# Patient Record
Sex: Female | Born: 1975 | Hispanic: Yes | Marital: Married | Smoking: Never smoker
Health system: Southern US, Community
[De-identification: ages and names within clinical notes are randomized; demographics above are authoritative.]

## PROBLEM LIST (undated history)

## (undated) DIAGNOSIS — E781 Pure hyperglyceridemia: Secondary | ICD-10-CM

## (undated) DIAGNOSIS — I10 Essential (primary) hypertension: Secondary | ICD-10-CM

## (undated) HISTORY — DX: Essential (primary) hypertension: I10

## (undated) HISTORY — DX: Pure hyperglyceridemia: E78.1

---

## 2014-01-10 ENCOUNTER — Telehealth: Payer: Self-pay | Admitting: *Deleted

## 2014-01-10 ENCOUNTER — Encounter (INDEPENDENT_AMBULATORY_CARE_PROVIDER_SITE_OTHER): Payer: Self-pay

## 2014-01-10 NOTE — Telephone Encounter (Signed)
Patient walked into office today complaining with neck pain. The family member that was with her said that last night when the pain happened that her face started drooping and her speech went slurred. Family member advised to take her straight to the ER for evaluation. Family member agreed and understood the importance.

## 2014-01-15 ENCOUNTER — Ambulatory Visit (INDEPENDENT_AMBULATORY_CARE_PROVIDER_SITE_OTHER): Payer: Managed Care, Other (non HMO)

## 2014-01-15 ENCOUNTER — Ambulatory Visit (INDEPENDENT_AMBULATORY_CARE_PROVIDER_SITE_OTHER): Payer: Managed Care, Other (non HMO) | Admitting: Family Medicine

## 2014-01-15 ENCOUNTER — Telehealth: Payer: Self-pay | Admitting: Family Medicine

## 2014-01-15 VITALS — BP 123/82 | HR 84 | Temp 100.2°F | Ht 63.0 in | Wt 150.2 lb

## 2014-01-15 DIAGNOSIS — G4700 Insomnia, unspecified: Secondary | ICD-10-CM

## 2014-01-15 DIAGNOSIS — K219 Gastro-esophageal reflux disease without esophagitis: Secondary | ICD-10-CM

## 2014-01-15 DIAGNOSIS — R509 Fever, unspecified: Secondary | ICD-10-CM

## 2014-01-15 DIAGNOSIS — R1032 Left lower quadrant pain: Secondary | ICD-10-CM

## 2014-01-15 DIAGNOSIS — N390 Urinary tract infection, site not specified: Secondary | ICD-10-CM

## 2014-01-15 LAB — POCT CBC
Granulocyte percent: 80.8 %G — AB (ref 37–80)
HCT, POC: 39 % (ref 37.7–47.9)
Hemoglobin: 11.8 g/dL — AB (ref 12.2–16.2)
Lymph, poc: 2.2 (ref 0.6–3.4)
MCH, POC: 23.4 pg — AB (ref 27–31.2)
MCHC: 30.1 g/dL — AB (ref 31.8–35.4)
MCV: 77.6 fL — AB (ref 80–97)
MPV: 8 fL (ref 0–99.8)
POC Granulocyte: 9.7 — AB (ref 2–6.9)
POC LYMPH PERCENT: 18.3 %L (ref 10–50)
Platelet Count, POC: 310 10*3/uL (ref 142–424)
RBC: 5 M/uL (ref 4.04–5.48)
RDW, POC: 15.2 %
WBC: 12 10*3/uL — AB (ref 4.6–10.2)

## 2014-01-15 LAB — POCT URINALYSIS DIPSTICK
Bilirubin, UA: NEGATIVE
Glucose, UA: NEGATIVE
Ketones, UA: NEGATIVE
Nitrite, UA: NEGATIVE
Protein, UA: NEGATIVE
Spec Grav, UA: 1.02
Urobilinogen, UA: NEGATIVE
pH, UA: 6

## 2014-01-15 LAB — POCT URINE PREGNANCY: Preg Test, Ur: NEGATIVE

## 2014-01-15 LAB — POCT UA - MICROSCOPIC ONLY
Bacteria, U Microscopic: NEGATIVE
Casts, Ur, LPF, POC: NEGATIVE
Crystals, Ur, HPF, POC: NEGATIVE
Mucus, UA: NEGATIVE
Yeast, UA: NEGATIVE

## 2014-01-15 MED ORDER — METRONIDAZOLE 500 MG PO TABS: 500.0000 mg | ORAL_TABLET | Freq: Three times a day (TID) | ORAL | Status: DC

## 2014-01-15 MED ORDER — CIPROFLOXACIN HCL 500 MG PO TABS: 500.0000 mg | ORAL_TABLET | Freq: Two times a day (BID) | ORAL | Status: DC

## 2014-01-15 MED ORDER — OMEPRAZOLE 20 MG PO CPDR: 20.0000 mg | DELAYED_RELEASE_CAPSULE | Freq: Every day | ORAL | Status: DC

## 2014-01-15 MED ORDER — PROMETHAZINE HCL 25 MG PO TABS: ORAL_TABLET | ORAL | Status: DC

## 2014-01-15 NOTE — Addendum Note (Signed)
Addended by: Earlene Plater on: 01/15/2014 03:06 PM   Modules accepted: Orders

## 2014-01-15 NOTE — Telephone Encounter (Signed)
done

## 2014-01-15 NOTE — Progress Notes (Signed)
Subjective:    Patient ID: Toni Le, female    DOB: 03/15/1976, 38 y.o.   MRN: [id number]  HPI  This 38 y.o. female presents for evaluation of fever, insomnia, abdominal pain first week of April Then stopped.  She has been having another episode of fever, abdominal shaking and discomfort, Headache, diarrhea, and fatigue.  She was seen at the ED in [place] and she had CT scan Of head which was negative and she was rx'd fioricet.  She c/o umbilical pain.  She c/o nausea.   Review of Systems C/o fever and abdominal pain No chest pain, SOB, HA, dizziness, vision change, N/V, diarrhea, constipation, dysuria, urinary urgency or frequency, myalgias, arthralgias or rash.     Objective:   Physical Exam  Vital signs noted  Well developed well nourished female.  HEENT - Head atraumatic Normocephalic                Eyes - PERRLA, Conjuctiva - clear Sclera- Clear EOMI                Ears - EAC's Wnl TM's Wnl Gross Hearing WNL                Nose - Nares patent                 Throat - oropharanx wnl Respiratory - Lungs CTA bilateral Cardiac - RRR S1 and S2 without murmur GI - Abdomen soft tender LLQ and LUQ and bowel sounds active x 4 Extremities - No edema. Neuro - Grossly intact.  Results for orders placed in visit on 01/15/14  POCT CBC      Result Value Ref Range   WBC 12.0 (*) 4.6 - 10.2 K/uL   Lymph, poc 2.2  0.6 - 3.4   POC LYMPH PERCENT 18.3  10 - 50 %L   MID (cbc)    0 - 0.9   POC MID %    0 - 12 %M   POC Granulocyte 9.7 (*) 2 - 6.9   Granulocyte percent 80.8 (*) 37 - 80 %G   RBC 5.0  4.04 - 5.48 M/uL   Hemoglobin 11.8 (*) 12.2 - 16.2 g/dL   HCT, POC 39.0  37.7 - 47.9 %   MCV 77.6 (*) 80 - 97 fL   MCH, POC 23.4 (*) 27 - 31.2 pg   MCHC 30.1 (*) 31.8 - 35.4 g/dL   RDW, POC 15.2     Platelet Count, POC 310.0  142 - 424 K/uL   MPV 8.0  0 - 99.8 fL  POCT URINALYSIS DIPSTICK      Result Value Ref Range   Color, UA gold     Clarity, UA clear     Glucose, UA  neg     Bilirubin, UA neg     Ketones, UA neg     Spec Grav, UA 1.020     Blood, UA mod     pH, UA 6.0     Protein, UA neg     Urobilinogen, UA negative     Nitrite, UA neg     Leukocytes, UA large (3+)    POCT UA - MICROSCOPIC ONLY      Result Value Ref Range   WBC, Ur, HPF, POC 15-20     RBC, urine, microscopic 5-10     Bacteria, U Microscopic neg     Mucus, UA neg     Epithelial cells, urine per micros occ  Crystals, Ur, HPF, POC neg     Casts, Ur, LPF, POC neg     Yeast, UA neg    Kub - No free air  CXR - No infiltrate Prelimnary reading by Gwyndolyn Saxon [place],FNP    Assessment & Plan:  Fever, unspecified - Plan: POCT CBC, POCT urinalysis dipstick, POCT UA - Microscopic Only, DG Chest 2 View, DG Abd 1 View, ciprofloxacin (CIPRO) 500 MG tablet, metroNIDAZOLE (FLAGYL) 500 MG tablet  Abdominal pain, left lower quadrant - Plan: DG Abd 1 View, ciprofloxacin (CIPRO) 500 MG tablet, metroNIDAZOLE (FLAGYL) 500 MG tablet  UTI (urinary tract infection) - Plan: Urine culture, ciprofloxacin (CIPRO) 500 MG tablet, metroNIDAZOLE (FLAGYL) 500 MG tablet  Lysbeth Penner FNP

## 2014-01-17 LAB — URINE CULTURE

## 2014-01-22 ENCOUNTER — Ambulatory Visit: Payer: Managed Care, Other (non HMO) | Admitting: Family Medicine

## 2015-10-03 ENCOUNTER — Encounter: Payer: Self-pay | Admitting: Family

## 2015-10-03 ENCOUNTER — Ambulatory Visit (INDEPENDENT_AMBULATORY_CARE_PROVIDER_SITE_OTHER): Payer: Commercial Managed Care - HMO | Admitting: Family

## 2015-10-03 VITALS — BP 120/74 | HR 71 | Temp 99.1°F | Ht 63.0 in | Wt 161.4 lb

## 2015-10-03 DIAGNOSIS — Z23 Encounter for immunization: Secondary | ICD-10-CM

## 2015-10-03 DIAGNOSIS — Z0000 Encounter for general adult medical examination without abnormal findings: Secondary | ICD-10-CM | POA: Diagnosis not present

## 2015-10-03 DIAGNOSIS — Z01419 Encounter for gynecological examination (general) (routine) without abnormal findings: Secondary | ICD-10-CM | POA: Diagnosis not present

## 2015-10-03 DIAGNOSIS — K219 Gastro-esophageal reflux disease without esophagitis: Secondary | ICD-10-CM | POA: Insufficient documentation

## 2015-10-03 LAB — POCT URINALYSIS DIPSTICK
BILIRUBIN UA: NEGATIVE
Glucose, UA: NEGATIVE
KETONES UA: NEGATIVE
Leukocytes, UA: NEGATIVE
NITRITE UA: NEGATIVE
PROTEIN UA: NEGATIVE
Spec Grav, UA: 1.01
Urobilinogen, UA: NEGATIVE
pH, UA: 6.5

## 2015-10-03 LAB — POCT UA - MICROSCOPIC ONLY
BACTERIA, U MICROSCOPIC: NEGATIVE
CRYSTALS, UR, HPF, POC: NEGATIVE
Casts, Ur, LPF, POC: NEGATIVE
Mucus, UA: NEGATIVE
WBC, Ur, HPF, POC: NEGATIVE
Yeast, UA: NEGATIVE

## 2015-10-03 NOTE — Progress Notes (Signed)
   Subjective:    Patient ID: Toni Le, female    DOB: [birth year], 40 y.o.   MRN: [id number]  PT presents to the office today for CPE with pap.  Gynecologic Exam Associated symptoms include dysuria and frequency. Pertinent negatives include no headaches or hematuria.  Dysuria  This is a new problem. The current episode started in the past 7 days. The problem occurs intermittently. The problem has been waxing and waning. The quality of the pain is described as aching. The pain is at a severity of 3/10. The pain is mild. Associated symptoms include frequency and hesitancy. Pertinent negatives include no hematuria. She has tried increased fluids for the symptoms. The treatment provided mild relief.      Review of Systems  Constitutional: Negative.   HENT: Negative.   Eyes: Negative.   Respiratory: Negative.  Negative for shortness of breath.   Cardiovascular: Negative.  Negative for palpitations.  Gastrointestinal: Negative.   Endocrine: Negative.   Genitourinary: Positive for dysuria, hesitancy and frequency. Negative for hematuria.  Musculoskeletal: Negative.   Neurological: Negative.  Negative for headaches.  Hematological: Negative.   Psychiatric/Behavioral: Negative.   All other systems reviewed and are negative.      Objective:   Physical Exam  Constitutional: She is oriented to person, place, and time. She appears well-developed and well-nourished. No distress.  HENT:  Head: Normocephalic and atraumatic.  Right Ear: External ear normal.  Left Ear: External ear normal.  Nose: Nose normal.  Mouth/Throat: Oropharynx is clear and moist.  Eyes: Pupils are equal, round, and reactive to light.  Neck: Normal range of motion. Neck supple. No thyromegaly present.  Cardiovascular: Normal rate, regular rhythm, normal heart sounds and intact distal pulses.   No murmur heard. Pulmonary/Chest: Effort normal and breath sounds normal. No respiratory distress. She has no wheezes.  Right breast exhibits no inverted nipple, no mass, no nipple discharge, no skin change and no tenderness. Left breast exhibits no inverted nipple, no mass, no nipple discharge, no skin change and no tenderness. Breasts are symmetrical.  Abdominal: Soft. Bowel sounds are normal. She exhibits no distension. There is no tenderness.  Genitourinary: Vagina normal and uterus normal. Guaiac negative stool.  Bimanual exam- no adnexal masses or tenderness, ovaries nonpalpable   Cervix parous and pink- No discharge   Musculoskeletal: Normal range of motion. She exhibits no edema or tenderness.  Neurological: She is alert and oriented to person, place, and time. She has normal reflexes. No cranial nerve deficit.  Skin: Skin is warm and dry.  Psychiatric: She has a normal mood and affect. Her behavior is normal. Judgment and thought content normal.  Vitals reviewed.     BP 120/74 mmHg  Pulse 71  Temp(Src) 99.1 F (37.3 C) (Oral)  Ht '5\' 3"'$  (1.6 m)  Wt 161 lb 6.4 oz (73.211 kg)  BMI 28.60 kg/m2  LMP 09/02/2015 (Approximate)     Assessment & Plan:  1. Annual physical exam - Anemia Profile B - CMP14+EGFR - Lipid panel - Thyroid Panel With TSH - VITAMIN D 25 Hydroxy (Vit-D Deficiency, Fractures) - Pap IG w/ reflex to HPV when ASC-U - POCT urinalysis dipstick - POCT UA - Microscopic Only  2. Encounter for routine gynecological examination  - CMP14+EGFR - VITAMIN D 25 Hydroxy (Vit-D Deficiency, Fractures) - POCT urinalysis dipstick - POCT UA - Microscopic Only   Continue all meds Labs pending Health Maintenance reviewed Diet and exercise encouraged RTO 1 year  Evelina Dun, FNP

## 2015-10-03 NOTE — Patient Instructions (Addendum)
Toni Le (Health Maintenance, Female) Un estilo de vida saludable y los cuidados preventivos pueden favorecer considerablemente a la salud y Musician. Pregunte a su mdico cul es el cronograma de exmenes peridicos apropiado para usted. Esta es una buena oportunidad para consultarlo sobre cmo prevenir enfermedades y Toni Le sano. Adems de los controles, hay muchas otras cosas que puede hacer usted mismo. Los expertos han realizado numerosas investigaciones Toni Le cambios en el estilo de vida y las medidas de prevencin que, Toni Le, lo ayudarn a mantenerse sano. Solicite a su mdico ms informacin. EL PESO Y LA DIETA  Consuma una dieta saludable.  Asegrese de Toni Le verduras, frutas, productos lcteos de bajo contenido de Toni Le y Advertising account planner.  No consuma muchos alimentos de alto contenido de grasas slidas, azcares agregados o sal.  Realice actividad fsica con regularidad. Esta es una de las prcticas ms importantes que puede hacer por su salud.  La mayora de los adultos deben hacer ejercicio durante al menos 173mnutos por semana. El ejercicio debe aumentar la frecuencia cardaca y pActorla transpiracin (ejercicio de iEnola.  La mayora de los adultos tambin deben hField seismologistejercicios de elongacin al mToysRusveces a la semana. Agregue esto al su plan de ejercicio de intensidad moderada. Mantenga un peso saludable.  El ndice de masa corporal ([facility] es una medida que puede utilizarse para identificar posibles problemas de pBelle Valley Proporciona una estimacin de la grasa corporal basndose en el peso y la altura. Su mdico puede ayudarle a dRadiation protection practitionerIAvenely a lScientist, forensico mTheatre managerun peso saludable.  Para las mujeres de 20aos o ms:  Un IMadera Ambulatory Endoscopy Centermenor de 18,5 se considera bajo peso.  Un IAbbeville Area Medical Centerentre 18,5 y 24,9 es normal.  Un IMethodist Hospitalentre 25 y 29,9 se considera sobrepeso.  Un IMC de 30 o ms se considera  obesidad. Observe los niveles de colesterol y lpidos en la sangre.  Debe comenzar a rEnglish as a second language teacherde lpidos y cResearch officer, trade unionen la sangre a los 20aos y luego repetirlos cada 513aos  Es posible que nAutomotive engineerlos niveles de colesterol con mayor frecuencia si:  Sus niveles de lpidos y colesterol son altos.  Es mayor de 50aos.  Presenta un alto riesgo de padecer enfermedades cardacas. DETECCIN DE CNCER  Cncer de pulmn  Se recomienda realizar exmenes de deteccin de cncer de pulmn a personas adultas entre 557y 833aos que estn en riesgo de dHorticulturist, commercialde pulmn por sus antecedentes de consumo de tabaco.  Se recomienda una tomografa computarizada de baja dosis de los pLiberty Mediaaos a las personas que:  Fuman actualmente.  Hayan dejado el hbito en algn momento en los ltimos 15aos.  Hayan fumado durante 30aos un paquete diario. Un paquete-ao equivale a fumar un promedio de un paquete de cigarrillos diario durante un ao.  Los exmenes de deteccin anuales deben continuar hasta que hayan pasado 15aos desde que dej de fumar.  Ya no debern realizarse si tiene un problema de salud que le impida recibir tratamiento para eScience writerde pulmn. Cncer de mama  Practique la autoconciencia de la mama. Esto significa reconocer la apariencia normal de sus mamas y cmo las siente.  Tambin significa realizar autoexmenes regulares de lJohnson & Toni Informe a su mdico sobre cualquier cambio, sin importar cun pequeo sea.  Si tiene entre 20 y 31aos, un mdico debe realizarle un examen clnico de las mBrunswick Corporationparte del examen regular de sHohen[place] cK[place]  1 a 3aos.  Si tiene 40aos o ms, debe realizarse un examen clnico de las mamas todos los aos. Tambin considere realizarse una radiografa de las mamas (mamografa) todos los aos.  Si tiene antecedentes familiares de cncer de mama, hable con su mdico para someterse a un estudio  gentico.  Si tiene alto riesgo de padecer cncer de mama, hable con su mdico para someterse a una resonancia magntica y una mamografa todos los aos.  La evaluacin del gen del cncer de mama (BRCA) se recomienda a mujeres que tengan familiares con cnceres relacionados con el BRCA. Los cnceres relacionados con el BRCA incluyen los siguientes:  Mama.  Ovario.  Trompas.  Cnceres de peritoneo.  Los resultados de la evaluacin determinarn la necesidad de asesoramiento gentico y de anlisis de BRCA1 y BRCA2. Cncer de cuello del tero El mdico puede recomendarle que se haga pruebas peridicas de deteccin de cncer de los rganos de la pelvis (ovarios, tero y vagina). Estas pruebas incluyen un examen plvico, que abarca controlar si se produjeron cambios microscpicos en la superficie del cuello del tero (prueba de Papanicolaou). Pueden recomendarle que se haga estas pruebas cada 3aos, a partir de los 21aos.  A las mujeres que tienen entre 30 y 65aos, los mdicos pueden recomendarles que se sometan a exmenes plvicos y pruebas de Papanicolaou cada 3aos, o a la prueba de Papanicolaou y el examen plvico en combinacin con estudios de deteccin del virus del papiloma humano (VPH) cada 5aos. Algunos tipos de VPH aumentan el riesgo de padecer cncer de cuello del tero. La prueba para la deteccin del VPH tambin puede realizarse a mujeres de cualquier edad cuyos resultados de la prueba de Papanicolaou no sean claros.  Es posible que otros mdicos no recomienden exmenes de deteccin a mujeres no embarazadas que se consideran sujetos de bajo riesgo de padecer cncer de pelvis y que no tienen sntomas. Pregntele al mdico si un examen plvico de deteccin es adecuado para usted.  Si ha recibido un tratamiento para el cncer cervical o una enfermedad que podra causar cncer, necesitar realizarse una prueba de Papanicolaou y controles durante al menos 20 aos de concluido el  tratamiento. Si no se ha hecho el Papanicolaou con regularidad, debern volver a evaluarse los factores de riesgo (como tener un nuevo compaero sexual), para determinar si debe realizarse los estudios nuevamente. Algunas mujeres sufren problemas mdicos que aumentan la probabilidad de contraer cncer de cuello del tero. En estos casos, el mdico podr indicar que se realicen controles y pruebas de Papanicolaou con ms frecuencia. Cncer colorrectal  Este tipo de cncer puede detectarse y a menudo prevenirse.  Por lo general, los estudios de rutina se deben comenzar a hacer a partir de los 50 aos y hasta los 75 aos.  Sin embargo, el mdico podr aconsejarle que lo haga antes, si tiene factores de riesgo para el cncer de colon.  Tambin puede recomendarle que use un kit de prueba para hallar sangre oculta en la materia fecal.  Es posible que se use una pequea cmara en el extremo de un tubo para examinar directamente el colon (sigmoidoscopia o colonoscopia) a fin de detectar formas tempranas de cncer colorrectal.  Los exmenes de rutina generalmente comienzan a los 50aos.  El examen directo del colon se debe repetir cada 5 a 10aos hasta los 75aos. Sin embargo, es posible que se realicen exmenes con mayor frecuencia, si se detectan formas tempranas de plipos precancerosos o pequeos bultos. Cncer de piel  Revise   la piel de la cabeza a los pies con regularidad.  Informe a su mdico si aparecen nuevos lunares o los que tiene se modifican, especialmente en su forma y color.  Tambin notifique al mdico si tiene un lunar que es ms grande que el tamao de una goma de lpiz.  Siempre use pantalla solar. Aplique pantalla solar de Kerry Dory y repetida a lo largo del Training and development officer.  Protjase usando mangas y [employer], un sombrero de ala ancha y gafas para el sol, siempre que se encuentre en el exterior. ENFERMEDADES CARDACAS, DIABETES E HIPERTENSIN ARTERIAL   La hipertensin  arterial causa enfermedades cardacas y [place] el riesgo de ictus. La hipertensin arterial es ms probable en los siguientes casos:  Las personas que tienen la presin arterial en el extremo del rango normal (100-139/85-89 mm Hg).  Las personas con sobrepeso u obesidad.  Las Retail banker.  Si usted tiene entre 18 y 39 aos, debe medirse la presin arterial cada 3 a 5 aos. Si usted tiene 40 aos o ms, debe medirse la presin arterial [employer]. Debe medirse la presin arterial dos veces: una vez cuando est en un hospital o una clnica y la otra vez cuando est en otro sitio. Registre el promedio de [place]. Para controlar su presin arterial cuando no est en un hospital o [place], puede usar lo siguiente:  Ardelia Mems mquina automtica para medir la presin arterial en una farmacia.  Un monitor para medir la presin arterial en el hogar.  Si tiene entre 33 y 105 aos, consulte a su mdico si debe tomar aspirina para prevenir el ictus.  Realcese exmenes de deteccin de la diabetes con regularidad. Esto incluye la toma de [place] de sangre para controlar el nivel de azcar en la sangre durante el [place].  Si tiene un peso normal y un bajo riesgo de padecer diabetes, realcese este anlisis cada tres aos despus de los 45aos.  Si tiene sobrepeso y un alto riesgo de padecer diabetes, considere someterse a este anlisis antes o con mayor frecuencia. PREVENCIN DE INFECCIONES  HepatitisB  Si tiene un riesgo ms alto de Museum/gallery curator hepatitis B, debe someterse a un examen de deteccin de este virus. Se considera que tiene un alto riesgo de Museum/gallery curator hepatitis B si:  Naci en un pas donde la hepatitis B es frecuente. Pregntele a su mdico qu pases son considerados de Public affairs consultant.  Sus padres nacieron en un pas de alto riesgo y usted no recibi una vacuna que lo proteja contra la hepatitis B (vacuna contra la hepatitis B).  [place] agujas  para inyectarse drogas.  Vive con alguien que tiene hepatitis B.  Ha tenido sexo con alguien que tiene hepatitis B.  Recibe tratamiento de hemodilisis.  Toma ciertos medicamentos para el cncer, trasplante de rganos y afecciones autoinmunitarias. Hepatitis C  Se recomienda un anlisis de [place] para:  Todos los que nacieron entre 1945 y [id number].  Todas las personas que tengan un riesgo de haber contrado hepatitis C. Enfermedades de transmisin sexual (ETS).  Debe realizarse pruebas de deteccin de enfermedades de transmisin sexual (ETS), incluidas gonorrea y clamidia si:  Es sexualmente activo y es menor de 24aos.  Es mayor de 24aos, y Investment banker, operational informa que corre riesgo de tener este tipo de infecciones.  La actividad sexual ha cambiado desde que le hicieron la ltima prueba de deteccin y tiene un riesgo mayor de Best boy clamidia o Radio broadcast assistant. Pregntele  al mdico si usted tiene riesgo.  Si no tiene el VIH, pero corre riesgo de infectarse por el virus, se recomienda tomar diariamente un medicamento recetado para evitar la infeccin. Esto se conoce como profilaxis previa a la exposicin. Se considera que est en riesgo si:  Es activo sexualmente y no usa preservativos habitualmente o no conoce el estado del VIH de sus parejas sexuales.  Se inyecta drogas.  Es activo sexualmente con una pareja que tiene VIH. Consulte a su mdico para saber si tiene un alto riesgo de infectarse por el VIH. Si opta por comenzar la profilaxis previa a la exposicin, primero debe realizarse anlisis de deteccin del VIH. Luego, le harn anlisis cada 3meses mientras est tomando los medicamentos para la profilaxis previa a la exposicin.  EMBARAZO   Si es premenopusica y puede quedar embarazada, solicite a su mdico asesoramiento previo a la concepcin.  Si puede quedar embarazada, tome 400 a 800microgramos (mcg) de cido flico todos los das.  Si desea evitar el embarazo, hable con su  mdico sobre el control de la natalidad (anticoncepcin). OSTEOPOROSIS Y MENOPAUSIA   La osteoporosis es una enfermedad en la que los huesos pierden los minerales y la fuerza por el avance de la edad. El resultado pueden ser fracturas graves en los huesos. El riesgo de osteoporosis puede identificarse con una prueba de densidad sea.  Si tiene 65aos o ms, o si est en riesgo de sufrir osteoporosis y fracturas, pregunte a su mdico si debe someterse a exmenes.  Consulte a su mdico si debe tomar un suplemento de calcio o de vitamina D para reducir el riesgo de osteoporosis.  La menopausia puede presentar ciertos sntomas fsicos y riesgos.  La terapia de reemplazo hormonal puede reducir algunos de estos sntomas y riesgos. Consulte a su mdico para saber si la terapia de reemplazo hormonal es conveniente para usted.  INSTRUCCIONES PARA EL CUIDADO EN EL HOGAR   Realcese los estudios de rutina de la salud, dentales y de la vista.  Mantngase al da con las vacunas.  No consuma ningn producto que contenga tabaco, lo que incluye cigarrillos, tabaco de mascar o cigarrillos electrnicos.  Si est embarazada, no beba alcohol.  Si est amamantando, reduzca el consumo de alcohol y la frecuencia con la que consume.  Si es mujer y no est embarazada limite el consumo de alcohol a no ms de 1 medida por da. Una medida equivale a 12onzas de cerveza, 5onzas de vino o 1onzas de bebidas alcohlicas de alta graduacin.  No consuma drogas.  No comparta agujas.  Solicite ayuda a su mdico si necesita apoyo o informacin para abandonar las drogas.  Informe a su mdico si a menudo se siente deprimido.  Notifique a su mdico si alguna vez ha sido vctima de abuso o si no se siente seguro en su hogar.   Esta informacin no tiene como fin reemplazar el consejo del mdico. Asegrese de hacerle al mdico cualquier pregunta que tenga.   Document Released: 08/26/2011 Document Revised:  09/27/2014 Elsevier Interactive Patient Education 2016 Elsevier Inc.  

## 2015-10-04 LAB — THYROID PANEL WITH TSH
FREE THYROXINE INDEX: 1.4 (ref 1.2–4.9)
T3 Uptake Ratio: 21 % — ABNORMAL LOW (ref 24–39)
T4, Total: 6.6 ug/dL (ref 4.5–12.0)
TSH: 1.78 u[IU]/mL (ref 0.450–4.500)

## 2015-10-04 LAB — CMP14+EGFR
ALBUMIN: 4.3 g/dL (ref 3.5–5.5)
ALT: 15 IU/L (ref 0–32)
AST: 21 IU/L (ref 0–40)
Albumin/Globulin Ratio: 1.3 (ref 1.1–2.5)
Alkaline Phosphatase: 79 IU/L (ref 39–117)
BUN / CREAT RATIO: 10 (ref 8–20)
BUN: 6 mg/dL (ref 6–20)
Bilirubin Total: 0.2 mg/dL (ref 0.0–1.2)
CHLORIDE: 99 mmol/L (ref 96–106)
CO2: 22 mmol/L (ref 18–29)
Calcium: 9 mg/dL (ref 8.7–10.2)
Creatinine, Ser: 0.59 mg/dL (ref 0.57–1.00)
GFR calc Af Amer: 134 mL/min/{1.73_m2} (ref >59)
GFR calc non Af Amer: 116 mL/min/{1.73_m2} (ref >59)
GLOBULIN, TOTAL: 3.2 g/dL (ref 1.5–4.5)
Glucose: 88 mg/dL (ref 65–99)
POTASSIUM: 3.6 mmol/L (ref 3.5–5.2)
Sodium: 143 mmol/L (ref 134–144)
Total Protein: 7.5 g/dL (ref 6.0–8.5)

## 2015-10-04 LAB — ANEMIA PROFILE B
BASOS: 0 %
Basophils Absolute: 0 10*3/uL (ref 0.0–0.2)
EOS (ABSOLUTE): 0.1 10*3/uL (ref 0.0–0.4)
Eos: 1 %
Ferritin: 11 ng/mL — ABNORMAL LOW (ref 15–150)
Folate: 12.9 ng/mL (ref >3.0)
HEMATOCRIT: 36.4 % (ref 34.0–46.6)
Hemoglobin: 11.8 g/dL (ref 11.1–15.9)
IRON SATURATION: 11 % — AB (ref 15–55)
IRON: 49 ug/dL (ref 27–159)
Immature Grans (Abs): 0 10*3/uL (ref 0.0–0.1)
Immature Granulocytes: 0 %
LYMPHS ABS: 1.4 10*3/uL (ref 0.7–3.1)
Lymphs: 21 %
MCH: 24.8 pg — AB (ref 26.6–33.0)
MCHC: 32.4 g/dL (ref 31.5–35.7)
MCV: 77 fL — AB (ref 79–97)
MONOS ABS: 0.5 10*3/uL (ref 0.1–0.9)
Monocytes: 8 %
NEUTROS ABS: 4.7 10*3/uL (ref 1.4–7.0)
NEUTROS PCT: 70 %
Platelets: 273 10*3/uL (ref 150–379)
RBC: 4.76 x10E6/uL (ref 3.77–5.28)
RDW: 15.9 % — ABNORMAL HIGH (ref 12.3–15.4)
Retic Ct Pct: 1.2 % (ref 0.6–2.6)
Total Iron Binding Capacity: 437 ug/dL (ref 250–450)
UIBC: 388 ug/dL (ref 131–425)
VITAMIN B 12: 612 pg/mL (ref 211–946)
WBC: 6.7 10*3/uL (ref 3.4–10.8)

## 2015-10-04 LAB — LIPID PANEL
Chol/HDL Ratio: 4.7 ratio units — ABNORMAL HIGH (ref 0.0–4.4)
Cholesterol, Total: 182 mg/dL (ref 100–199)
HDL: 39 mg/dL — ABNORMAL LOW (ref >39)
LDL Calculated: 100 mg/dL — ABNORMAL HIGH (ref 0–99)
Triglycerides: 216 mg/dL — ABNORMAL HIGH (ref 0–149)
VLDL Cholesterol Cal: 43 mg/dL — ABNORMAL HIGH (ref 5–40)

## 2015-10-04 LAB — VITAMIN D 25 HYDROXY (VIT D DEFICIENCY, FRACTURES): VIT D 25 HYDROXY: 10.3 ng/mL — AB (ref 30.0–100.0)

## 2015-10-07 LAB — PAP IG W/ RFLX HPV ASCU: PAP Smear Comment: 0

## 2015-10-08 ENCOUNTER — Other Ambulatory Visit: Payer: Self-pay | Admitting: Family

## 2015-10-08 ENCOUNTER — Telehealth: Payer: Self-pay | Admitting: *Deleted

## 2015-10-08 DIAGNOSIS — E559 Vitamin D deficiency, unspecified: Secondary | ICD-10-CM | POA: Insufficient documentation

## 2015-10-08 MED ORDER — VITAMIN D (ERGOCALCIFEROL) 1.25 MG (50000 UNIT) PO CAPS: 50000.0000 [IU] | ORAL_CAPSULE | ORAL | Status: DC

## 2015-10-08 NOTE — Telephone Encounter (Signed)
Unable to contact patient by phone .  Letter mailed, please call for results.

## 2016-08-30 ENCOUNTER — Other Ambulatory Visit: Payer: Self-pay | Admitting: Family

## 2016-12-06 ENCOUNTER — Other Ambulatory Visit: Payer: Self-pay | Admitting: Family

## 2017-03-07 ENCOUNTER — Other Ambulatory Visit: Payer: Self-pay | Admitting: Family

## 2017-03-25 ENCOUNTER — Other Ambulatory Visit: Payer: Self-pay | Admitting: Family

## 2017-03-28 NOTE — Telephone Encounter (Signed)
Last Vit D 10/03/15  10.3

## 2017-06-20 ENCOUNTER — Other Ambulatory Visit: Payer: Self-pay | Admitting: Family

## 2017-06-20 NOTE — Telephone Encounter (Signed)
Last Vit D 10/02/16  10.3

## 2017-10-03 ENCOUNTER — Encounter: Payer: Self-pay | Admitting: Family

## 2017-10-03 ENCOUNTER — Ambulatory Visit (INDEPENDENT_AMBULATORY_CARE_PROVIDER_SITE_OTHER): Payer: BLUE CROSS/BLUE SHIELD | Admitting: Family

## 2017-10-03 VITALS — BP 136/94 | HR 77 | Temp 97.7°F | Ht 63.0 in | Wt 171.0 lb

## 2017-10-03 DIAGNOSIS — J301 Allergic rhinitis due to pollen: Secondary | ICD-10-CM

## 2017-10-03 DIAGNOSIS — R102 Pelvic and perineal pain: Secondary | ICD-10-CM | POA: Diagnosis not present

## 2017-10-03 DIAGNOSIS — Z0000 Encounter for general adult medical examination without abnormal findings: Secondary | ICD-10-CM

## 2017-10-03 DIAGNOSIS — Z01419 Encounter for gynecological examination (general) (routine) without abnormal findings: Secondary | ICD-10-CM

## 2017-10-03 DIAGNOSIS — E663 Overweight: Secondary | ICD-10-CM | POA: Insufficient documentation

## 2017-10-03 DIAGNOSIS — E669 Obesity, unspecified: Secondary | ICD-10-CM | POA: Diagnosis not present

## 2017-10-03 LAB — URINALYSIS, COMPLETE
BILIRUBIN UA: NEGATIVE
GLUCOSE, UA: NEGATIVE
KETONES UA: NEGATIVE
LEUKOCYTES UA: NEGATIVE
Nitrite, UA: NEGATIVE
SPEC GRAV UA: 1.02 (ref 1.005–1.030)
Urobilinogen, Ur: 0.2 mg/dL (ref 0.2–1.0)
pH, UA: 7 (ref 5.0–7.5)

## 2017-10-03 LAB — MICROSCOPIC EXAMINATION: RENAL EPITHEL UA: NONE SEEN /HPF

## 2017-10-03 MED ORDER — FLUTICASONE PROPIONATE 50 MCG/ACT NA SUSP: 2.0000 | Freq: Every day | NASAL | 6 refills | Status: DC

## 2017-10-03 NOTE — Patient Instructions (Signed)
[place] (Health Maintenance, Female) Un estilo de vida saludable y los cuidados preventivos pueden favorecer considerablemente a la salud y Musician. Pregunte a su mdico cul es el cronograma de exmenes peridicos apropiado para usted. Esta es una buena oportunidad para consultarlo sobre cmo prevenir enfermedades y [place] sano. Adems de los controles, hay muchas otras cosas que puede hacer usted mismo. Los expertos han realizado numerosas investigaciones [name] en el estilo de vida y las medidas de prevencin que, [place], lo ayudarn a mantenerse sano. Solicite a su mdico ms informacin. EL PESO Y LA DIETA Consuma una dieta saludable.  Asegrese de [employer] verduras, frutas, productos lcteos de bajo contenido de [place] y Advertising account planner.  No consuma muchos alimentos de alto contenido de grasas slidas, azcares agregados o sal.  Realice actividad fsica con regularidad. Esta es una de las prcticas ms importantes que puede hacer por su salud. ? La Delorise Shiner de los adultos deben hacer ejercicio durante al menos 124mnutos por semana. El ejercicio debe aumentar la frecuencia cardaca y pActorla transpiracin (ejercicio de [place]. ? La mayora de los adultos tambin deben hacer ejercicios de elongacin al mToysRusveces a la semana. Agregue esto al su plan de ejercicio de intensidad moderada. Mantenga un peso saludable.  El ndice de masa corporal ([facility] es una medida que puede utilizarse para identificar posibles problemas de pEast Uniontown Proporciona una estimacin de la grasa corporal basndose en el peso y la altura. Su mdico puede ayudarle a dRadiation protection practitionerISouth Endy a lScientist, forensico mTheatre managerun peso saludable.  Para las mujeres de 20aos o ms: ? Un IJohn R. Oishei Children'S Hospitalmenor de 18,5 se considera bajo peso. ? Un ICumberland County Hospitalentre 18,5 y 24,9 es normal. ? Un IPelham Medical Centerentre 25 y 29,9 se considera sobrepeso. ? Un IMC de 30 o ms se considera  obesidad. Observe los niveles de colesterol y lpidos en la sangre.  Debe comenzar a rEnglish as a second language teacherde lpidos y cResearch officer, trade unionen la sangre a los 20aos y luego repetirlos cada 516aos  Es posible que nAutomotive engineerlos niveles de colesterol con mayor frecuencia si: ? Sus niveles de lpidos y colesterol son altos. ? Es mayor de 527CWC ? Presenta un alto riesgo de padecer enfermedades cardacas. DETECCIN DE CNCER Cncer de pulmn  Se recomienda realizar exmenes de deteccin de cncer de pulmn a personas adultas entre 574y 892aos que estn en riesgo de dHorticulturist, commercialde pulmn por sus antecedentes de consumo de tabaco.  Se recomienda una tomografa computarizada de baja dosis de los pulmones todos los aos a las personas que: ? Fuman actualmente. ? Hayan dejado el hbito en algn momento en los ltimos 15aos. ? Hayan fumado durante 30aos un paquete diario. Un paquete-ao equivale a fumar un promedio de un paquete de cigarrillos diario durante un ao.  Los exmenes de deteccin anuales deben continuar hasta que hayan pasado 15aos desde que dej de fumar.  Ya no debern realizarse si tiene un problema de salud que le impida recibir tratamiento para eScience writerde pulmn. Cncer de mama  Practique la autoconciencia de la mama. Esto significa reconocer la apariencia normal de sus mamas y cmo las siente.  Tambin significa realizar autoexmenes regulares de lJohnson & Johnson Informe a su mdico sobre cualquier cambio, sin importar cun pequeo sea.  Si tiene entre 20 y 363aos, un mdico debe realizarle un examen clnico de las mamas como parte del examen regular de s[place] cada 1 a  3aos.  Si tiene 40aos o ms, debe realizarse un examen clnico de las mamas todos los aos. Tambin considere realizarse una radiografa de las mamas (mamografa) todos los aos.  Si tiene antecedentes familiares de cncer de mama, hable con su mdico para someterse a un estudio gentico.  Si  tiene alto riesgo de padecer cncer de mama, hable con su mdico para someterse a una resonancia magntica y una mamografa todos los aos.  La evaluacin del gen del cncer de mama (BRCA) se recomienda a mujeres que tengan familiares con cnceres relacionados con el BRCA. Los cnceres relacionados con el BRCA incluyen los siguientes: ? Mama. ? Ovario. ? Trompas. ? Cnceres de peritoneo.  Los resultados de la evaluacin determinarn la necesidad de asesoramiento gentico y de anlisis de BRCA1 y BRCA2. Cncer de cuello del tero El mdico puede recomendarle que se haga pruebas peridicas de deteccin de cncer de los rganos de la pelvis (ovarios, tero y vagina). Estas pruebas incluyen un examen plvico, que abarca controlar si se produjeron cambios microscpicos en la superficie del cuello del tero (prueba de Papanicolaou). Pueden recomendarle que se haga estas pruebas cada 3aos, a partir de los 21aos.  A las mujeres que tienen entre 30 y 65aos, los mdicos pueden recomendarles que se sometan a exmenes plvicos y pruebas de Papanicolaou cada 3aos, o a la prueba de Papanicolaou y el examen plvico en combinacin con estudios de deteccin del virus del papiloma humano (VPH) cada 5aos. Algunos tipos de VPH aumentan el riesgo de padecer cncer de cuello del tero. La prueba para la deteccin del VPH tambin puede realizarse a mujeres de cualquier edad cuyos resultados de la prueba de Papanicolaou no sean claros.  Es posible que otros mdicos no recomienden exmenes de deteccin a mujeres no embarazadas que se consideran sujetos de bajo riesgo de padecer cncer de pelvis y que no tienen sntomas. Pregntele al mdico si un examen plvico de deteccin es adecuado para usted.  Si ha recibido un tratamiento para el cncer cervical o una enfermedad que podra causar cncer, necesitar realizarse una prueba de Papanicolaou y controles durante al menos 20 aos de concluido el tratamiento. Si no se  ha hecho el Papanicolaou con regularidad, debern volver a evaluarse los factores de riesgo (como tener un nuevo compaero sexual), para determinar si debe realizarse los estudios nuevamente. Algunas mujeres sufren problemas mdicos que aumentan la probabilidad de contraer cncer de cuello del tero. En estos casos, el mdico podr indicar que se realicen controles y pruebas de Papanicolaou con ms frecuencia. Cncer colorrectal  Este tipo de cncer puede detectarse y a menudo prevenirse.  Por lo general, los estudios de rutina se deben comenzar a hacer a partir de los 50 aos y hasta los 75 aos.  Sin embargo, el mdico podr aconsejarle que lo haga antes, si tiene factores de riesgo para el cncer de colon.  Tambin puede recomendarle que use un kit de prueba para hallar sangre oculta en la materia fecal.  Es posible que se use una pequea cmara en el extremo de un tubo para examinar directamente el colon (sigmoidoscopia o colonoscopia) a fin de detectar formas tempranas de cncer colorrectal.  Los exmenes de rutina generalmente comienzan a los 50aos.  El examen directo del colon se debe repetir cada 5 a 10aos hasta los 75aos. Sin embargo, es posible que se realicen exmenes con mayor frecuencia, si se detectan formas tempranas de plipos precancerosos o pequeos bultos. Cncer de piel  Revise la piel   de la cabeza a los pies con regularidad.  Informe a su mdico si aparecen nuevos lunares o los que tiene se modifican, especialmente en su forma y color.  Tambin notifique al mdico si tiene un lunar que es ms grande que el tamao de una goma de lpiz.  Siempre use pantalla solar. Aplique pantalla solar de manera libre y repetida a lo largo del da.  Protjase usando mangas y pantalones largos, un sombrero de ala ancha y gafas para el sol, siempre que se encuentre en el exterior. ENFERMEDADES CARDACAS, DIABETES E HIPERTENSIN ARTERIAL  La hipertensin arterial causa  enfermedades cardacas y aumenta el riesgo de ictus. La hipertensin arterial es ms probable en los siguientes casos: ? Las personas que tienen la presin arterial en el extremo del rango normal (100-139/85-89 mm Hg). ? Las personas con sobrepeso u obesidad. ? Las personas afroamericanas.  Si usted tiene entre 18 y 39 aos, debe medirse la presin arterial cada 3 a 5 aos. Si usted tiene 40 aos o ms, debe medirse la presin arterial todos los aos. Debe medirse la presin arterial dos veces: una vez cuando est en un hospital o una clnica y la otra vez cuando est en otro sitio. Registre el promedio de las dos mediciones. Para controlar su presin arterial cuando no est en un hospital o una clnica, puede usar lo siguiente: ? Una mquina automtica para medir la presin arterial en una farmacia. ? Un monitor para medir la presin arterial en el hogar.  Si tiene entre 55 y 79 aos, consulte a su mdico si debe tomar aspirina para prevenir el ictus.  Realcese exmenes de deteccin de la diabetes con regularidad. Esto incluye la toma de una muestra de sangre para controlar el nivel de azcar en la sangre durante el ayuno. ? Si tiene un peso normal y un bajo riesgo de padecer diabetes, realcese este anlisis cada tres aos despus de los 45aos. ? Si tiene sobrepeso y un alto riesgo de padecer diabetes, considere someterse a este anlisis antes o con mayor frecuencia. PREVENCIN DE INFECCIONES HepatitisB  Si tiene un riesgo ms alto de contraer hepatitis B, debe someterse a un examen de deteccin de este virus. Se considera que tiene un alto riesgo de contraer hepatitis B si: ? Naci en un pas donde la hepatitis B es frecuente. Pregntele a su mdico qu pases son considerados de alto riesgo. ? Sus padres nacieron en un pas de alto riesgo y usted no recibi una vacuna que lo proteja contra la hepatitis B (vacuna contra la hepatitis B). ? Tiene VIH o sida. ? Usa agujas para inyectarse  drogas. ? Vive con alguien que tiene hepatitis B. ? Ha tenido sexo con alguien que tiene hepatitis B. ? Recibe tratamiento de hemodilisis. ? Toma ciertos medicamentos para el cncer, trasplante de rganos y afecciones autoinmunitarias. Hepatitis C  Se recomienda un anlisis de sangre para: ? Todos los que nacieron entre 1945 y 1965. ? Todas las personas que tengan un riesgo de haber contrado hepatitis C. Enfermedades de transmisin sexual (ETS).  Debe realizarse pruebas de deteccin de enfermedades de transmisin sexual (ETS), incluidas gonorrea y clamidia si: ? Es sexualmente activo y es menor de 24aos. ? Es mayor de 24aos, y el mdico le informa que corre riesgo de tener este tipo de infecciones. ? La actividad sexual ha cambiado desde que le hicieron la ltima prueba de deteccin y tiene un riesgo mayor de tener clamidia o gonorrea. Pregntele al mdico si usted   tiene riesgo.  Si no tiene el VIH, pero corre riesgo de infectarse por el virus, se recomienda tomar diariamente un medicamento recetado para evitar la infeccin. Esto se conoce como profilaxis previa a la exposicin. Se considera que est en riesgo si: ? Es [place] sexualmente y no [place] preservativos habitualmente o no conoce el estado del VIH de sus Advertising copywriter. ? Se inyecta drogas. ? Es [place] sexualmente con Ardelia Mems pareja que tiene VIH. Consulte a su mdico para saber si tiene un alto riesgo de infectarse por el VIH. Si opta por comenzar la profilaxis previa a la exposicin, primero debe realizarse anlisis de deteccin del VIH. Luego, le harn anlisis cada 34mses mientras est tomando los medicamentos para la profilaxis previa a la exposicin. [facility] Si es premenopusica y puede quedar e[place] solicite a su mdico asesoramiento previo a la concepcin.  Si puede quedar embarazada, tome 400 a [id number](mcg) de cido fAnheuser-Busch  Si desea evitar el embarazo, hable con su mdico sobre el  control de la natalidad (anticoncepcin). OSTEOPOROSIS Y MENOPAUSIA  La osteoporosis es una enfermedad en la que los huesos pierden los minerales y la fuerza por el avance de la edad. El resultado pueden ser fracturas graves en los h[place] El riesgo de osteoporosis puede identificarse con uArdelia Memsprueba de densidad sea.  Si tiene 65aos o ms, o si est en riesgo de sufrir osteoporosis y fracturas, pregunte a su mdico si debe someterse a exmenes.  Consulte a su mdico si debe tomar un suplemento de calcio o de vitamina D para reducir el riesgo de osteoporosis.  La menopausia puede presentar ciertos sntomas fsicos y rGaffer  La terapia de reemplazo hormonal puede reducir algunos de estos sntomas y rGaffer Consulte a su mdico para saber si la terapia de reemplazo hormonal es conveniente para usted. INSTRUCCIONES PARA EL CUIDADO EN EL HOGAR  Realcese los estudios de rutina de la salud, dentales y de lPublic librarian  MBath  No consuma ningn producto que contenga tabaco, lo que incluye cigarrillos, tabaco de mHigher education careers advisero cPsychologist, sport and exercise  Si est embarazada, no beba alcohol.  Si est amamantando, reduzca el consumo de alcohol y la frecuencia con la que consume.  Si es mujer y no est embarazada limite el consumo de alcohol a no ms de 1 medida por da. Una medida equivale a 12onzas de cerveza, 5onzas de vino o 1onzas de bebidas alcohlicas de alta graduacin.  No consuma drogas.  No comparta agujas.  Solicite ayuda a su mdico si necesita apoyo o informacin para abandonar las drogas.  Informe a su mdico si a menudo se siente deprimido.  Notifique a su mdico si alguna vez ha sido vctima de abuso o si no se siente seguro en su hogar. Esta informacin no tiene cMarine scientistel consejo del mdico. Asegrese de hacerle al mdico cualquier pregunta que tenga. Document Released: 08/26/2011 Document Revised: 09/27/2014 Document Reviewed:  06/10/2015 Elsevier Interactive Patient Education  2[employer]

## 2017-10-03 NOTE — Progress Notes (Signed)
   Subjective:    Patient ID: Toni Le, female    DOB: 1976-05-07, 42 y.o.   MRN: [id number]  Pt presents to the office today for CPE with pap. Pt currently not taking any medications at this time.  Gynecologic Exam  The patient's pertinent negatives include no genital lesions, genital odor, missed menses, vaginal bleeding or vaginal discharge. Associated symptoms include abdominal pain (lower at times). Pertinent negatives include no frequency, joint pain, nausea or painful intercourse.      Review of Systems  Gastrointestinal: Positive for abdominal pain (lower at times). Negative for nausea.  Genitourinary: Negative for frequency, missed menses and vaginal discharge.  Musculoskeletal: Negative for joint pain.  All other systems reviewed and are negative.      Objective:   Physical Exam  Constitutional: She is oriented to person, place, and time. She appears well-developed and well-nourished. No distress.  HENT:  Head: Normocephalic and atraumatic.  Right Ear: External ear normal.  Left Ear: External ear normal.  Nose: Mucosal edema and rhinorrhea present.  Mouth/Throat: Posterior oropharyngeal erythema present.  Eyes: Pupils are equal, round, and reactive to light.  Neck: Normal range of motion. Neck supple. No thyromegaly present.  Cardiovascular: Normal rate, regular rhythm, normal heart sounds and intact distal pulses.  No murmur heard. Pulmonary/Chest: Effort normal and breath sounds normal. No respiratory distress. She has no wheezes. Right breast exhibits no inverted nipple, no mass, no nipple discharge, no skin change and no tenderness. Left breast exhibits no inverted nipple, no mass, no nipple discharge, no skin change and no tenderness. Breasts are symmetrical.  Abdominal: Soft. Bowel sounds are normal. She exhibits no distension. There is no tenderness.  Genitourinary: Vagina normal.  Genitourinary Comments: Bimanual exam- no adnexal masses or tenderness, ovaries  nonpalpable   Cervix parous and pink- No discharge   Musculoskeletal: Normal range of motion. She exhibits no edema or tenderness.  Neurological: She is alert and oriented to person, place, and time.  Skin: Skin is warm and dry.  Psychiatric: She has a normal mood and affect. Her behavior is normal. Judgment and thought content normal.  Vitals reviewed.    BP (!) 136/94   Pulse 77   Temp 97.7 F (36.5 C) (Oral)   Ht '5\' 3"'$  (1.6 m)   Wt 171 lb (77.6 kg)   LMP 08/24/2017   BMI 30.29 kg/m      Assessment & Plan:  1. Pelvic pain - Urinalysis, Complete - CMP14+EGFR  2. Allergic rhinitis due to pollen, unspecified seasonality - fluticasone (FLONASE) 50 MCG/ACT nasal spray; Place 2 sprays into both nostrils daily.  Dispense: 16 g; Refill: 6 - CMP14+EGFR  3. Annual physical exam - Anemia Profile B - CMP14+EGFR - Lipid panel - TSH - VITAMIN D 25 Hydroxy (Vit-D Deficiency, Fractures) - Pap IG w/ reflex to HPV when ASC-U  4. Gynecologic exam normal - CMP14+EGFR - Pap IG w/ reflex to HPV when ASC-U  5. Obesity (BMI 30-39.9) - CMP14+EGFR   Continue all meds Labs pending Health Maintenance reviewed Diet and exercise encouraged RTO 1 year or if lower abd pain worsen may need Transvaginal US  Jocabed Cheese, FNP

## 2017-10-04 ENCOUNTER — Other Ambulatory Visit: Payer: Self-pay | Admitting: Family

## 2017-10-04 DIAGNOSIS — E781 Pure hyperglyceridemia: Secondary | ICD-10-CM

## 2017-10-04 LAB — ANEMIA PROFILE B
BASOS ABS: 0 10*3/uL (ref 0.0–0.2)
Basos: 0 %
EOS (ABSOLUTE): 0.1 10*3/uL (ref 0.0–0.4)
EOS: 1 %
FOLATE: 13.2 ng/mL (ref >3.0)
Ferritin: 23 ng/mL (ref 15–150)
HEMOGLOBIN: 12.5 g/dL (ref 11.1–15.9)
Hematocrit: 39.2 % (ref 34.0–46.6)
IMMATURE GRANULOCYTES: 0 %
IRON SATURATION: 17 % (ref 15–55)
IRON: 65 ug/dL (ref 27–159)
Immature Grans (Abs): 0 10*3/uL (ref 0.0–0.1)
Lymphocytes Absolute: 1.7 10*3/uL (ref 0.7–3.1)
Lymphs: 23 %
MCH: 25.8 pg — ABNORMAL LOW (ref 26.6–33.0)
MCHC: 31.9 g/dL (ref 31.5–35.7)
MCV: 81 fL (ref 79–97)
MONOCYTES: 7 %
Monocytes Absolute: 0.6 10*3/uL (ref 0.1–0.9)
NEUTROS ABS: 5.1 10*3/uL (ref 1.4–7.0)
Neutrophils: 69 %
Platelets: 314 10*3/uL (ref 150–379)
RBC: 4.85 x10E6/uL (ref 3.77–5.28)
RDW: 14.9 % (ref 12.3–15.4)
RETIC CT PCT: 2.3 % (ref 0.6–2.6)
TIBC: 381 ug/dL (ref 250–450)
UIBC: 316 ug/dL (ref 131–425)
VITAMIN B 12: 504 pg/mL (ref 232–1245)
WBC: 7.4 10*3/uL (ref 3.4–10.8)

## 2017-10-04 LAB — LIPID PANEL
Chol/HDL Ratio: 5 ratio — ABNORMAL HIGH (ref 0.0–4.4)
Cholesterol, Total: 166 mg/dL (ref 100–199)
HDL: 33 mg/dL — ABNORMAL LOW (ref >39)
LDL Calculated: 89 mg/dL (ref 0–99)
Triglycerides: 221 mg/dL — ABNORMAL HIGH (ref 0–149)
VLDL CHOLESTEROL CAL: 44 mg/dL — AB (ref 5–40)

## 2017-10-04 LAB — CMP14+EGFR
ALBUMIN: 4.2 g/dL (ref 3.5–5.5)
ALK PHOS: 84 IU/L (ref 39–117)
ALT: 22 IU/L (ref 0–32)
AST: 22 IU/L (ref 0–40)
Albumin/Globulin Ratio: 1.3 (ref 1.2–2.2)
BUN / CREAT RATIO: 11 (ref 9–23)
BUN: 7 mg/dL (ref 6–24)
Bilirubin Total: 0.3 mg/dL (ref 0.0–1.2)
CO2: 26 mmol/L (ref 20–29)
Calcium: 9 mg/dL (ref 8.7–10.2)
Chloride: 101 mmol/L (ref 96–106)
Creatinine, Ser: 0.64 mg/dL (ref 0.57–1.00)
GFR calc non Af Amer: 111 mL/min/{1.73_m2} (ref >59)
GFR, EST AFRICAN AMERICAN: 128 mL/min/{1.73_m2} (ref >59)
GLOBULIN, TOTAL: 3.2 g/dL (ref 1.5–4.5)
Glucose: 93 mg/dL (ref 65–99)
Potassium: 3.8 mmol/L (ref 3.5–5.2)
SODIUM: 141 mmol/L (ref 134–144)
TOTAL PROTEIN: 7.4 g/dL (ref 6.0–8.5)

## 2017-10-04 LAB — VITAMIN D 25 HYDROXY (VIT D DEFICIENCY, FRACTURES): VIT D 25 HYDROXY: 22.4 ng/mL — AB (ref 30.0–100.0)

## 2017-10-04 LAB — PAP IG W/ RFLX HPV ASCU: PAP Smear Comment: 0

## 2017-10-04 LAB — TSH: TSH: 2.09 u[IU]/mL (ref 0.450–4.500)

## 2017-10-04 MED ORDER — FLUCONAZOLE 150 MG PO TABS: 150.0000 mg | ORAL_TABLET | ORAL | 0 refills | Status: DC | PRN

## 2017-10-04 MED ORDER — VITAMIN D (ERGOCALCIFEROL) 1.25 MG (50000 UNIT) PO CAPS: 50000.0000 [IU] | ORAL_CAPSULE | ORAL | 3 refills | Status: DC

## 2018-07-21 ENCOUNTER — Ambulatory Visit: Payer: BLUE CROSS/BLUE SHIELD | Admitting: Family

## 2018-07-21 ENCOUNTER — Encounter: Payer: Self-pay | Admitting: Family

## 2018-07-21 VITALS — BP 131/91 | HR 76 | Temp 97.9°F | Ht 63.0 in | Wt 169.4 lb

## 2018-07-21 DIAGNOSIS — Z23 Encounter for immunization: Secondary | ICD-10-CM | POA: Diagnosis not present

## 2018-07-21 DIAGNOSIS — N6320 Unspecified lump in the left breast, unspecified quadrant: Secondary | ICD-10-CM

## 2018-07-21 NOTE — Patient Instructions (Addendum)
Mamografa Mammogram [employer] es un radiografa de las mamas que se realiza para determinar si hay cambios anormales. Este procedimiento permite explorar y Actuary cambio que pudiera sugerir la presencia de cncer de mama. La mamografa tambin puede identificar otros cambios y variaciones en las [place], por ejemplo:  La inflamacin del tejido mamario (mastitis).  Una zona infectada que contiene una acumulacin de pus (absceso).  Una cavidad llena de lquido (quiste).  Cambios fibroqusticos. Estos ocurren cuando el tejido [employer] se vuelve ms denso, lo que, a la palpacin, puede hacer que se perciba como una cuerda o una superficie irregular debajo de la piel.  Tumores no cancerosos (benignos).  Informe al mdico acerca de lo siguiente:  Cualquier alergia que tenga.  Si tiene implantes mamarios.  Si ha tenido enfermedades, biopsias o cirugas previas de Scientist, research (medical).  Si est amamantando.  Cualquier posibilidad de que estuviera embarazada, si corresponde.  Si es menor de 25aos.  Si tiene antecedentes familiares de cncer de mama. Cules son los riesgos? En general, se trata de un procedimiento seguro. Sin embargo, pueden ocurrir complicaciones, por ejemplo:  Exposicin a la radiacin. En [employer], los [place] de radiacin son muy bajos.  La interpretacin [employer].  La necesidad de Optometrist ms [employer].  La imposibilidad de la mamografa de Hydrographic surveyor algunos tipos de cncer.  Qu ocurre antes del procedimiento?  Programe el estudio para hacrselo aproximadamente 1 o 2semanas despus de la [place]. Generalmente, este es el momento en que las mamas estn menos sensibles.  Si se hizo Musician en un centro diferente en el pasado, trate de conseguirla o solicite que la enven al nuevo centro de estudios con el fin de compararlas.  El da del estudio, lvese las [place] y [place].  No use desodorantes, perfumes, lociones o  talcos en ningn lugar del cuerpo el da del [place].  Qutese las alhajas del cuello.  Use prendas que pueda ponerse y sacarse fcilmente. Qu ocurre durante el procedimiento?  Tendr que desvestirse de la cintura para [place] y ponerse una bata de hospital.  Debe permanecer de pie delante de la mquina de rayos-X.  Se colocar cada mama entre dos placas de [place] unos segundos. Durante el procedimiento, intente estar lo ms relajada posible. Esto no causa ningn dao a las [place], y, si siente [employer], ser pasajera.  Se tomarn radiografas desde diferentes ngulos de cada mama. Este procedimiento puede variar segn el mdico y el hospital. Sander Nephew sucede despus del procedimiento?  La mamografa ser examinada por un especialista (radilogo).  En funcin de la calidad de [employer], es posible que tenga que repetir algunas partes del [place]. Generalmente, esto se realiza si el radilogo necesita una vista mejor del tejido [place].  Pregunte la fecha en que los resultados estarn disponibles. Asegrese de [employer].  Puede reanudar sus actividades normales. Esta informacin no tiene Marine scientist el consejo del mdico. Asegrese de hacerle al mdico cualquier pregunta que tenga. Document Released: 06/16/2005 Document Revised: 12/14/2016 Document Reviewed: 11/15/2014 Elsevier Interactive Patient Education  Henry Schein.

## 2018-07-21 NOTE — Progress Notes (Signed)
   Subjective:    Patient ID: Toni Le, female    DOB: August 23, 1976, 42 y.o.   MRN: [id number]  Chief Complaint  Patient presents with  . lump in left breast    HPI Pt presents to the office today with a lump in her left breast that she noticed about two weeks ago. She reports it has become smaller. She states she has pain when she touches it.  She has never had a mammogram.   Denies any family hx of breast cancer.    Review of Systems  All other systems reviewed and are negative.      Objective:   Physical Exam  Constitutional: She is oriented to person, place, and time. She appears well-developed and well-nourished. No distress.  HENT:  Head: Normocephalic and atraumatic.  Right Ear: External ear normal.  Left Ear: External ear normal.  Nose: Mucosal edema and rhinorrhea present.  Mouth/Throat: Posterior oropharyngeal erythema present.  Eyes: Pupils are equal, round, and reactive to light.  Neck: Normal range of motion. Neck supple. No thyromegaly present.  Cardiovascular: Normal rate, regular rhythm, normal heart sounds and intact distal pulses.  No murmur heard. Pulmonary/Chest: Effort normal and breath sounds normal. No respiratory distress. She has no wheezes. Right breast exhibits no inverted nipple, no nipple discharge, no skin change and no tenderness. Mass: dense breast tissue. Left breast exhibits mass and tenderness. Left breast exhibits no inverted nipple, no nipple discharge and no skin change.    Abdominal: Soft. Bowel sounds are normal. She exhibits no distension. There is no tenderness.  Musculoskeletal: Normal range of motion. She exhibits no edema or tenderness.  Neurological: She is alert and oriented to person, place, and time. She has normal reflexes. No cranial nerve deficit.  Skin: Skin is warm and dry.  Psychiatric: She has a normal mood and affect. Her behavior is normal. Judgment and thought content normal.  Vitals reviewed.     BP (!)  131/91   Pulse 76   Temp 97.9 F (36.6 C) (Oral)   Ht 5\' 3"  (1.6 m)   Wt 169 lb 6.4 oz (76.8 kg)   BMI 30.01 kg/m      Assessment & Plan:  Toni Le comes in today with chief complaint of lump in left breast   Diagnosis and orders addressed:  1. Lump of left breast Area is tender and decreasing in size, hopeful not breast cancer. However, will order diagnostic mammogram. IT is scheduled for 11/05 Plan of care to follow after mammogram - MM DIAG BREAST TOMO BILATERAL; Future - US BREAST LTD UNI LEFT INC AXILLA; Future   Evelina Dun, FNP

## 2018-07-25 ENCOUNTER — Ambulatory Visit
Admission: RE | Admit: 2018-07-25 | Discharge: 2018-07-25 | Disposition: A | Discharge status: Home / Self Care | Payer: BLUE CROSS/BLUE SHIELD | Source: Ambulatory Visit | Attending: Family | Admitting: Family

## 2018-07-25 ENCOUNTER — Other Ambulatory Visit: Payer: Self-pay | Admitting: Family

## 2018-07-25 DIAGNOSIS — N6320 Unspecified lump in the left breast, unspecified quadrant: Secondary | ICD-10-CM

## 2018-07-25 DIAGNOSIS — N6310 Unspecified lump in the right breast, unspecified quadrant: Secondary | ICD-10-CM

## 2018-07-25 DIAGNOSIS — R928 Other abnormal and inconclusive findings on diagnostic imaging of breast: Secondary | ICD-10-CM | POA: Diagnosis not present

## 2018-07-25 DIAGNOSIS — N6489 Other specified disorders of breast: Secondary | ICD-10-CM | POA: Diagnosis not present

## 2018-07-25 DIAGNOSIS — N6311 Unspecified lump in the right breast, upper outer quadrant: Secondary | ICD-10-CM | POA: Diagnosis not present

## 2018-07-28 ENCOUNTER — Ambulatory Visit
Admission: RE | Admit: 2018-07-28 | Discharge: 2018-07-28 | Disposition: A | Discharge status: Home / Self Care | Payer: BLUE CROSS/BLUE SHIELD | Source: Ambulatory Visit | Attending: Family | Admitting: Family

## 2018-07-28 DIAGNOSIS — N6320 Unspecified lump in the left breast, unspecified quadrant: Secondary | ICD-10-CM

## 2018-07-28 DIAGNOSIS — D242 Benign neoplasm of left breast: Secondary | ICD-10-CM | POA: Diagnosis not present

## 2018-07-28 DIAGNOSIS — N6321 Unspecified lump in the left breast, upper outer quadrant: Secondary | ICD-10-CM | POA: Diagnosis not present

## 2018-09-12 DIAGNOSIS — Z6829 Body mass index (BMI) 29.0-29.9, adult: Secondary | ICD-10-CM | POA: Diagnosis not present

## 2018-09-12 DIAGNOSIS — S0181XA Laceration without foreign body of other part of head, initial encounter: Secondary | ICD-10-CM | POA: Diagnosis not present

## 2018-09-12 DIAGNOSIS — Z23 Encounter for immunization: Secondary | ICD-10-CM | POA: Diagnosis not present

## 2018-09-22 ENCOUNTER — Ambulatory Visit: Payer: BLUE CROSS/BLUE SHIELD | Admitting: Nurse Practitioner

## 2018-11-17 ENCOUNTER — Other Ambulatory Visit: Payer: Self-pay | Admitting: Family

## 2018-11-17 DIAGNOSIS — D242 Benign neoplasm of left breast: Secondary | ICD-10-CM

## 2018-11-17 DIAGNOSIS — N6310 Unspecified lump in the right breast, unspecified quadrant: Secondary | ICD-10-CM

## 2018-11-24 ENCOUNTER — Other Ambulatory Visit: Payer: Self-pay | Admitting: Family

## 2018-11-24 ENCOUNTER — Ambulatory Visit
Admission: RE | Admit: 2018-11-24 | Discharge: 2018-11-24 | Disposition: A | Discharge status: Home / Self Care | Payer: BLUE CROSS/BLUE SHIELD | Source: Ambulatory Visit | Attending: Family | Admitting: Family

## 2018-11-24 DIAGNOSIS — N6489 Other specified disorders of breast: Secondary | ICD-10-CM | POA: Diagnosis not present

## 2018-11-24 DIAGNOSIS — N6311 Unspecified lump in the right breast, upper outer quadrant: Secondary | ICD-10-CM | POA: Diagnosis not present

## 2018-11-24 DIAGNOSIS — R928 Other abnormal and inconclusive findings on diagnostic imaging of breast: Secondary | ICD-10-CM | POA: Diagnosis not present

## 2018-11-24 DIAGNOSIS — N6310 Unspecified lump in the right breast, unspecified quadrant: Secondary | ICD-10-CM

## 2018-11-24 DIAGNOSIS — N6320 Unspecified lump in the left breast, unspecified quadrant: Secondary | ICD-10-CM

## 2018-11-24 DIAGNOSIS — D242 Benign neoplasm of left breast: Secondary | ICD-10-CM

## 2018-11-28 ENCOUNTER — Other Ambulatory Visit: Payer: Self-pay | Admitting: Family

## 2019-01-14 IMAGING — US ULTRASOUND RIGHT BREAST LIMITED
1 series · 5 of 5 positions shown · non-contrast
Comparison: None.

CLINICAL DATA: 42-year-old presenting with a palpable lump in the
OUTER subareolar LEFT breast which is intermittently painful. The
lump was initially noted approximately 2 weeks ago. She states mild
trauma to that part of the LEFT breast prior to feeling the
lump.This is the patient's initial mammogram.

EXAM:
DIGITAL DIAGNOSTIC BILATERAL MAMMOGRAM WITH CAD AND TOMO
LIMITED ULTRASOUND BILATERAL BREASTS

[Series 1: ultrasound right breast limited · 0.07mm/px · 5 of 5 slices shown]
[im 1/5]
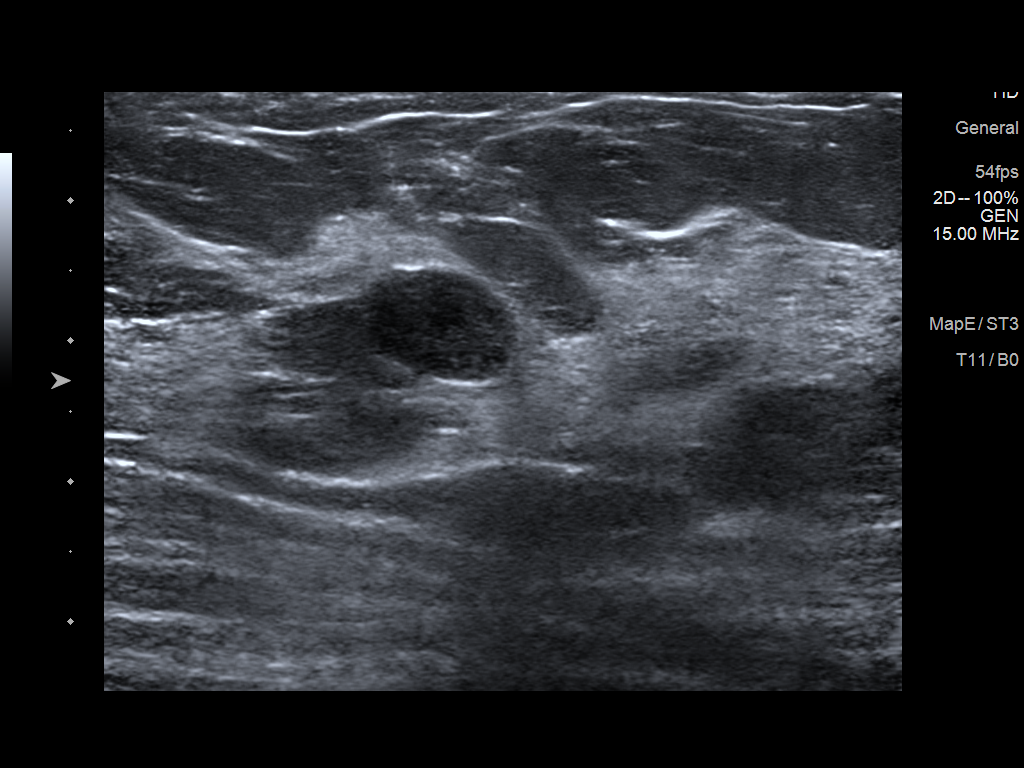
[im 2/5]
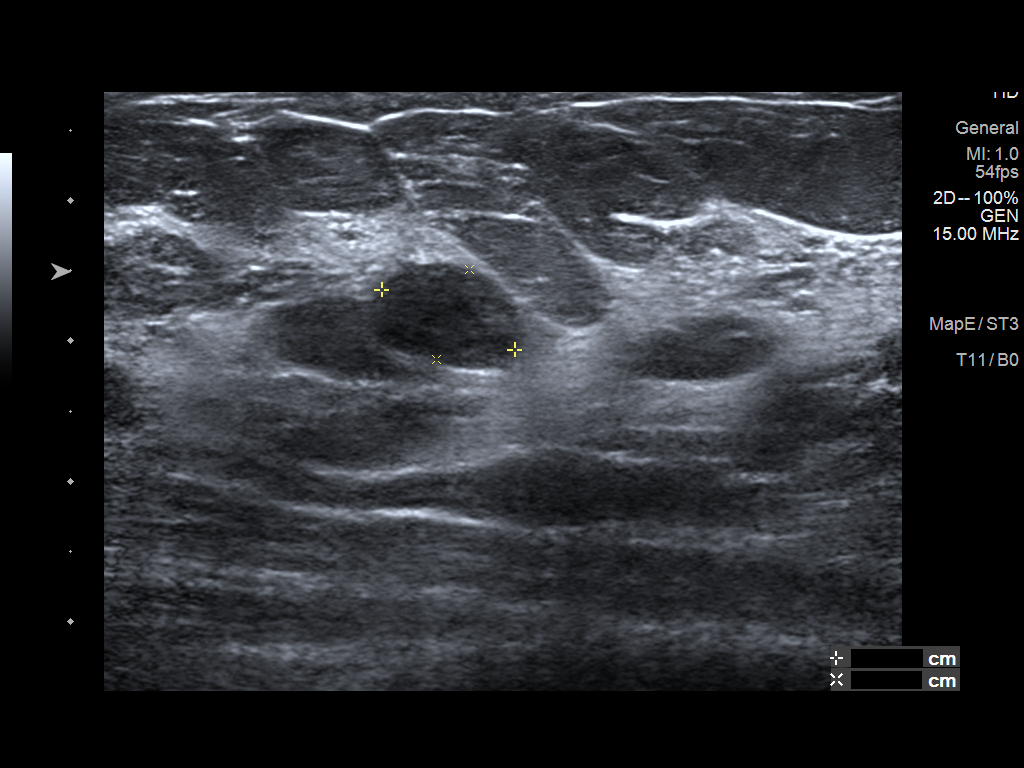
[im 3/5]
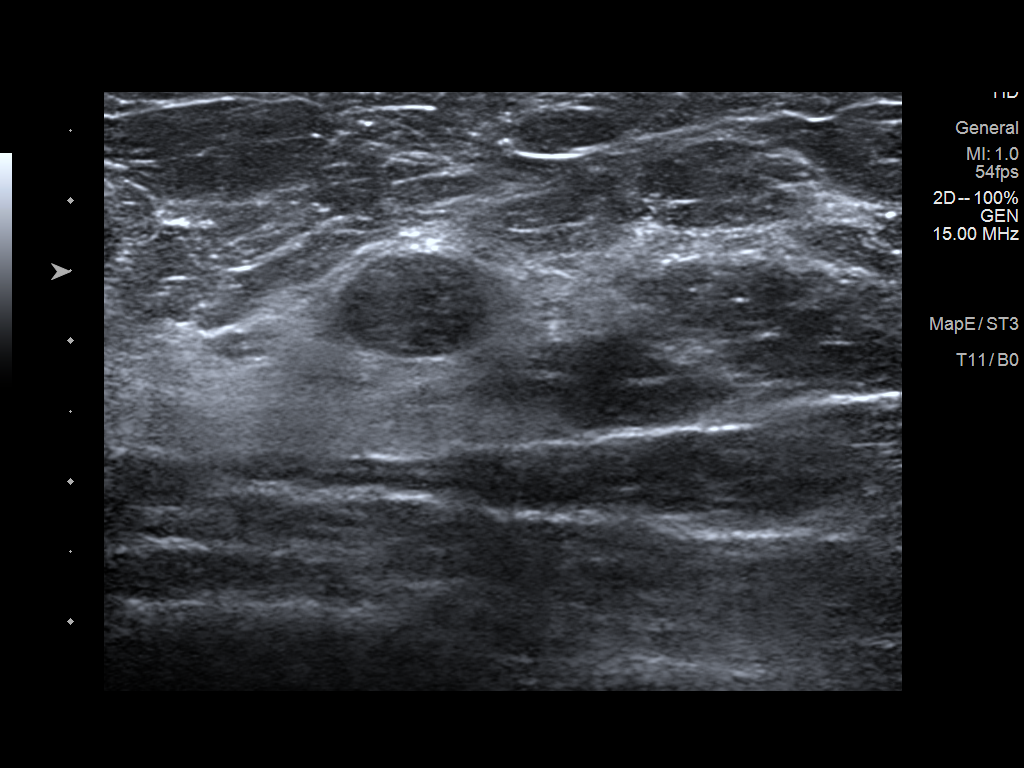
[im 4/5]
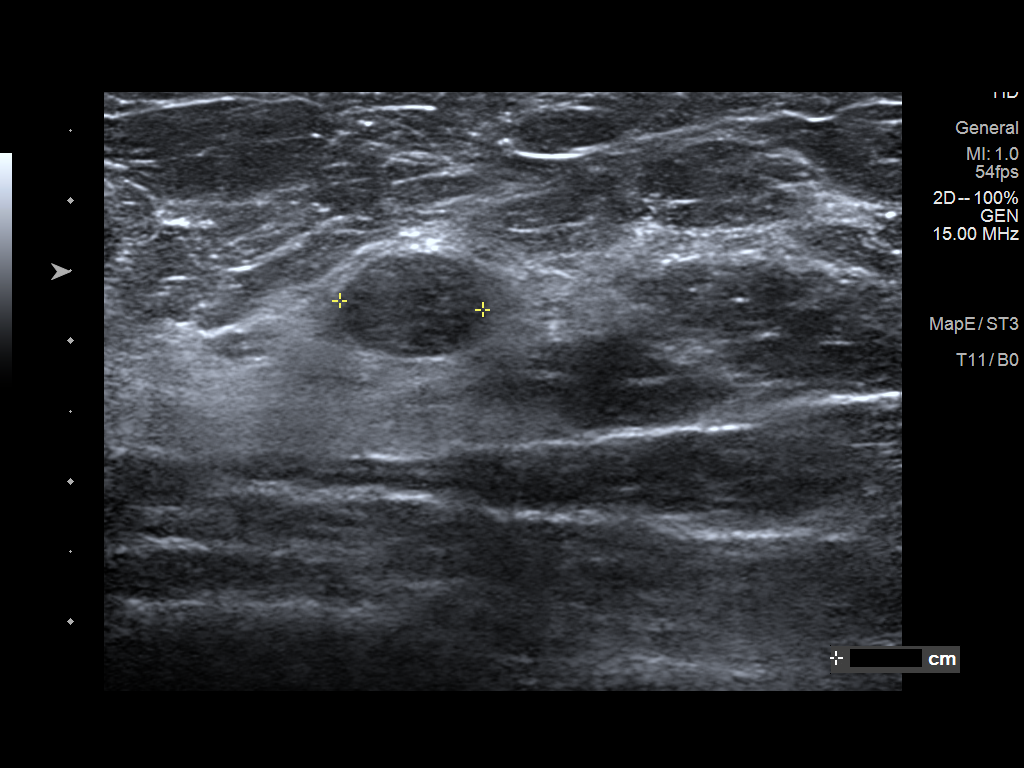
[im 5/5]
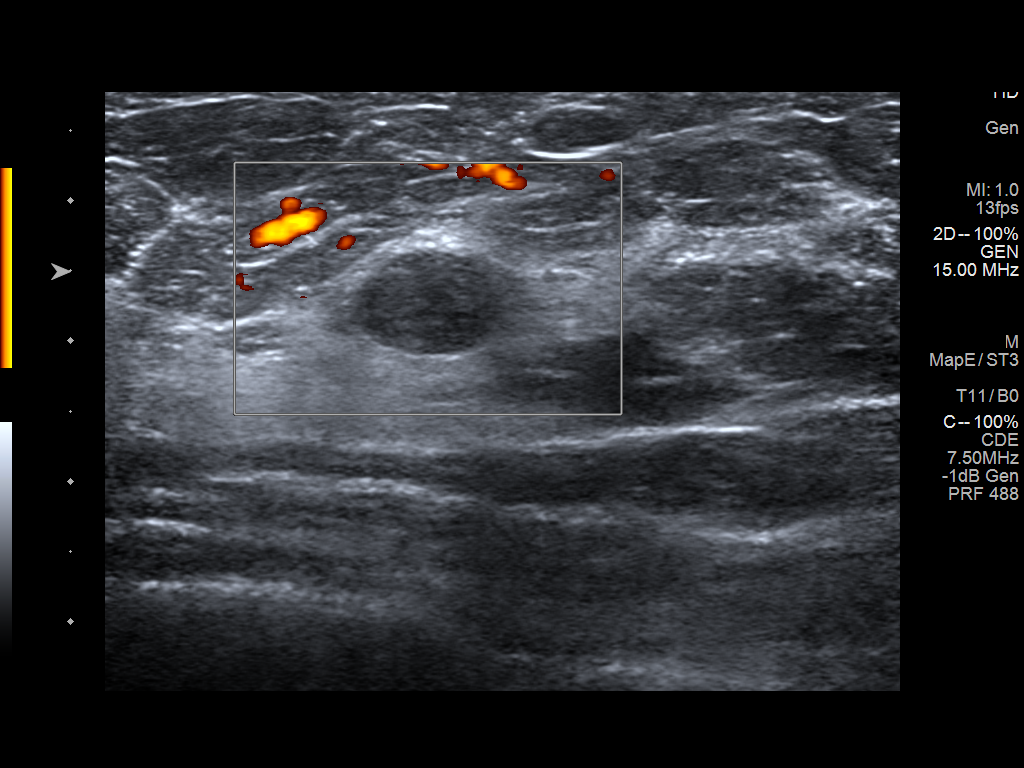

[5 of 5 positions shown; findings below may reference images not displayed]

ACR Breast Density Category b: There are scattered areas of
fibroglandular density.
FINDINGS: Tomosynthesis and synthesized full field CC and MLO views of both
breasts were obtained. Tomosynthesis and synthesized spot
compression tangential view of the area of concern in the LEFT
breast was also obtained.

Deep to the palpable concern in the OUTER subareolar LEFT breast is
a low-density mass with slightly lobular margins measuring
approximately 1.4 x 1.4 x 1.3 cm. There is no associated
architectural distortion or suspicious calcifications. No suspicious
findings elsewhere in the LEFT breast.

In the UPPER OUTER QUADRANT of the RIGHT breast at MIDDLE to
POSTERIOR depth is a circumscribed low-density mass measuring
approximately 1.3 x 1.2 x 1.1 cm. There is no associated
architectural distortion or suspicious calcifications. No suspicious
findings elsewhere in the RIGHT breast.

Mammographic images were processed with CAD.

On physical exam, there is a palpable superficial lump in the UPPER
OUTER portion of the LEFT areola corresponding to what the patient
is feeling.

Targeted LEFT breast ultrasound is performed, showing a hyperechoic
focus with a subcutaneous fat lobule at the 12 o'clock 1 cm from the
nipple measuring approximately 1.0 x 1.5 x 1.0 cm, demonstrating
adjacent power Doppler flow and no posterior characteristics,
corresponding to the palpable concern.

At the 11:30-12 o'clock position approximately 2 cm from the nipple
at MIDDLE depth is a hypoechoic mass with an angular LATERAL margin
and indistinct POSTERIOR margin measuring approximately 1.0 x 1.1 x
1.0 cm, demonstrating no posterior characteristics and no internal
power Doppler flow, corresponding to the mammographic finding.

Sonographic evaluation of the LEFT axilla demonstrates no pathologic
lymphadenopathy.

Targeted RIGHT breast ultrasound is performed, showing a
circumscribed oval parallel hypoechoic mass at the 10 o'clock
position approximately 7 cm from nipple measuring approximately
x 1.0 x 1.0 cm, demonstrating no posterior characteristics and no
internal power Doppler flow, corresponding to the mammographic
finding.
IMPRESSION: 1. Indeterminate approximate 1.1 cm mass involving the slight UPPER
OUTER QUADRANT of the LEFT breast at MIDDLE depth which corresponds
to a mammographic finding.
2. No pathologic LEFT axillary lymphadenopathy.
3. Likely benign fat necrosis involving the upper subareolar LEFT
breast which accounts for the patient's palpable concern.
4. Likely benign approximate 1 cm fibroadenoma involving the UPPER
OUTER QUADRANT of the RIGHT breast.

RECOMMENDATION:
1. Ultrasound-guided core needle biopsy of the indeterminate LEFT
breast mass.
2. If the LEFT breast biopsy returns as a benign fibroadenoma, then
six-month follow-up RIGHT breast ultrasound is recommended. If the
LEFT breast biopsy returns as a malignancy, then biopsy of the RIGHT
breast mass is recommended.
3. Follow-up LEFT breast ultrasound in 6 months to confirm the
expected evolution of the likely benign fat necrosis in the upper
subareolar location which accounts for the palpable concern.

I have discussed the findings and recommendations with the patient.
Communication with the patient was achieved with the assistance of a
certified interpreter. Results were also provided in writing at the
conclusion of the visit.

BI-RADS CATEGORY  4: Suspicious.

## 2019-01-14 IMAGING — MG DIGITAL DIAGNOSTIC BILATERAL MAMMOGRAM WITH TOMO AND CAD
5 of 10 series · 5 of 30 positions shown · non-contrast
Comparison: None.

CLINICAL DATA: 42-year-old presenting with a palpable lump in the
OUTER subareolar LEFT breast which is intermittently painful. The
lump was initially noted approximately 2 weeks ago. She states mild
trauma to that part of the LEFT breast prior to feeling the
lump.This is the patient's initial mammogram.

EXAM:
DIGITAL DIAGNOSTIC BILATERAL MAMMOGRAM WITH CAD AND TOMO
LIMITED ULTRASOUND BILATERAL BREASTS

[R MLO synth-2D]
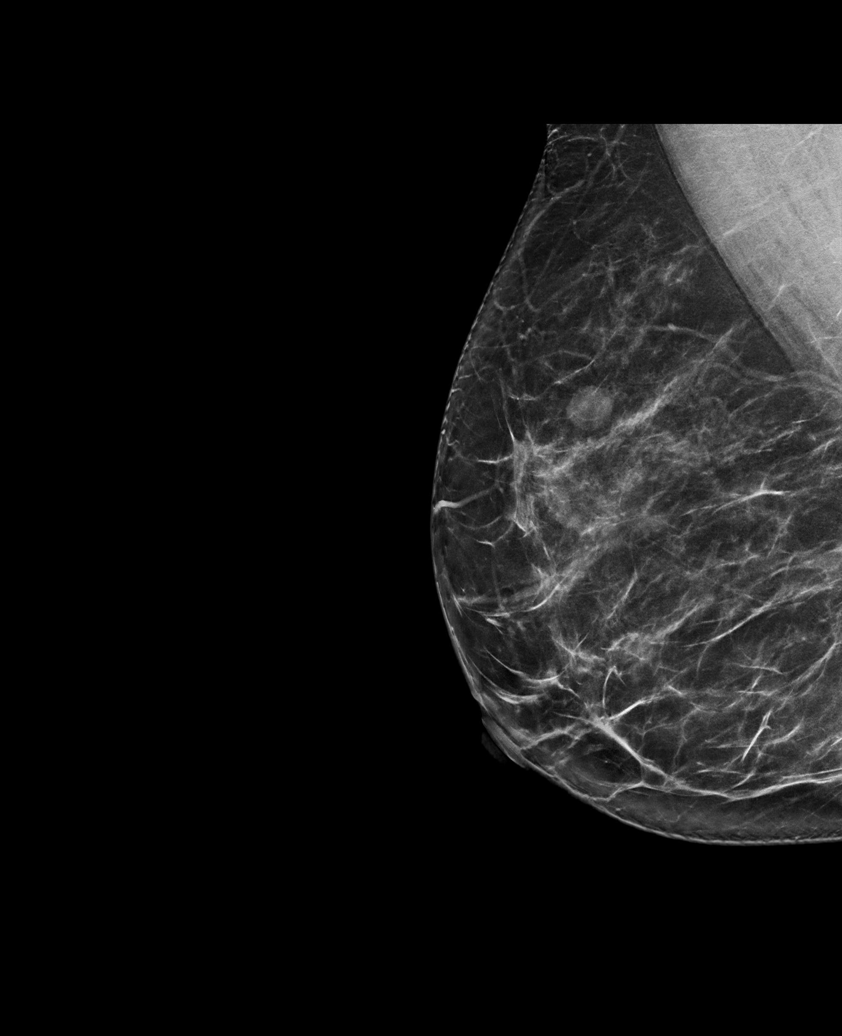

[L CC synth-2D]
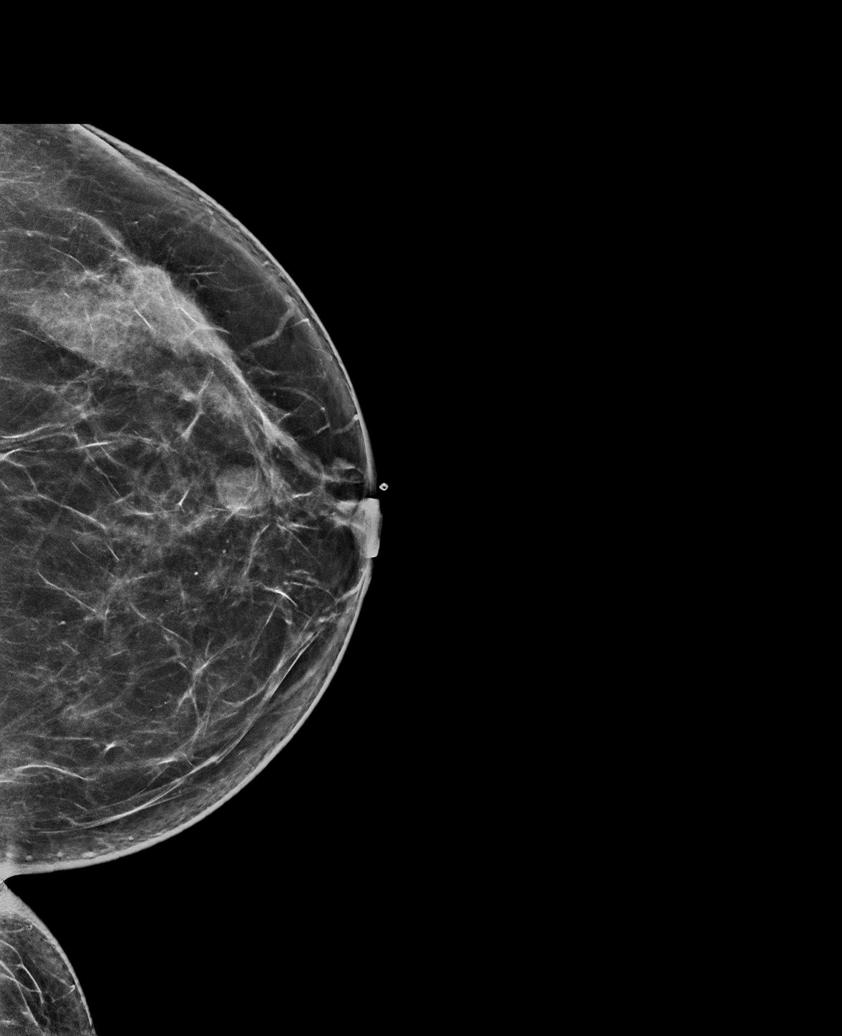

[R CC synth-2D]
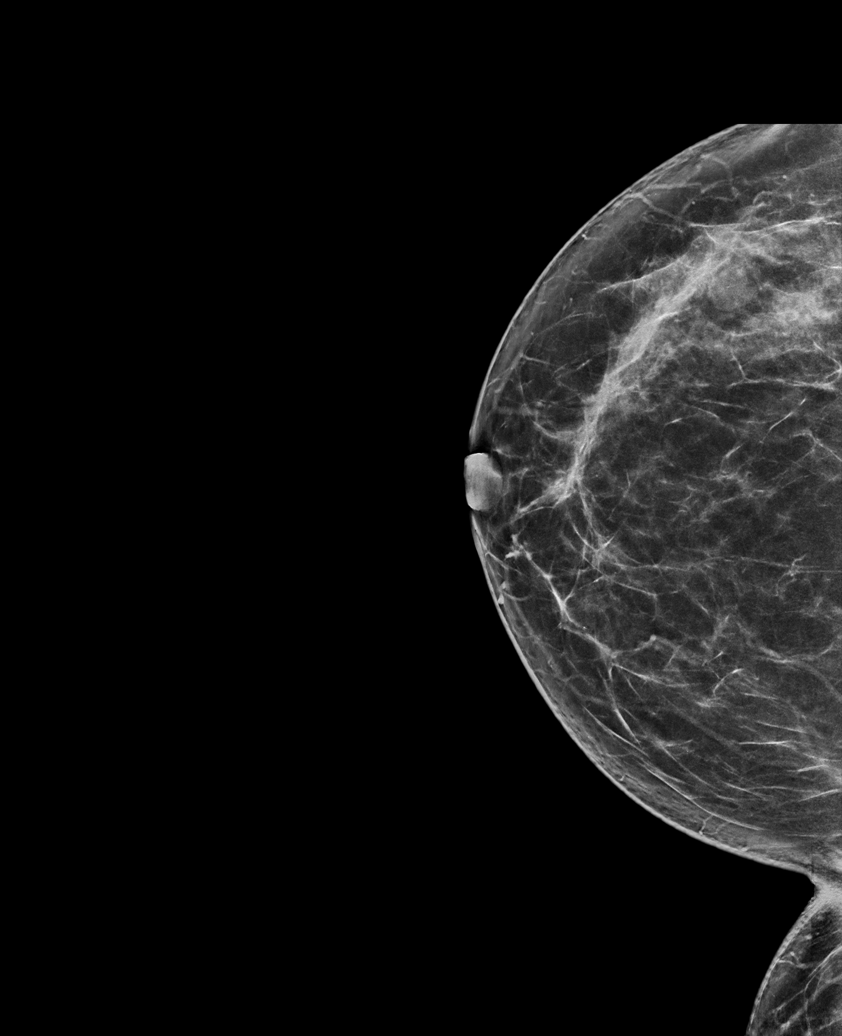

[L MLO synth-2D (1 of 2)]
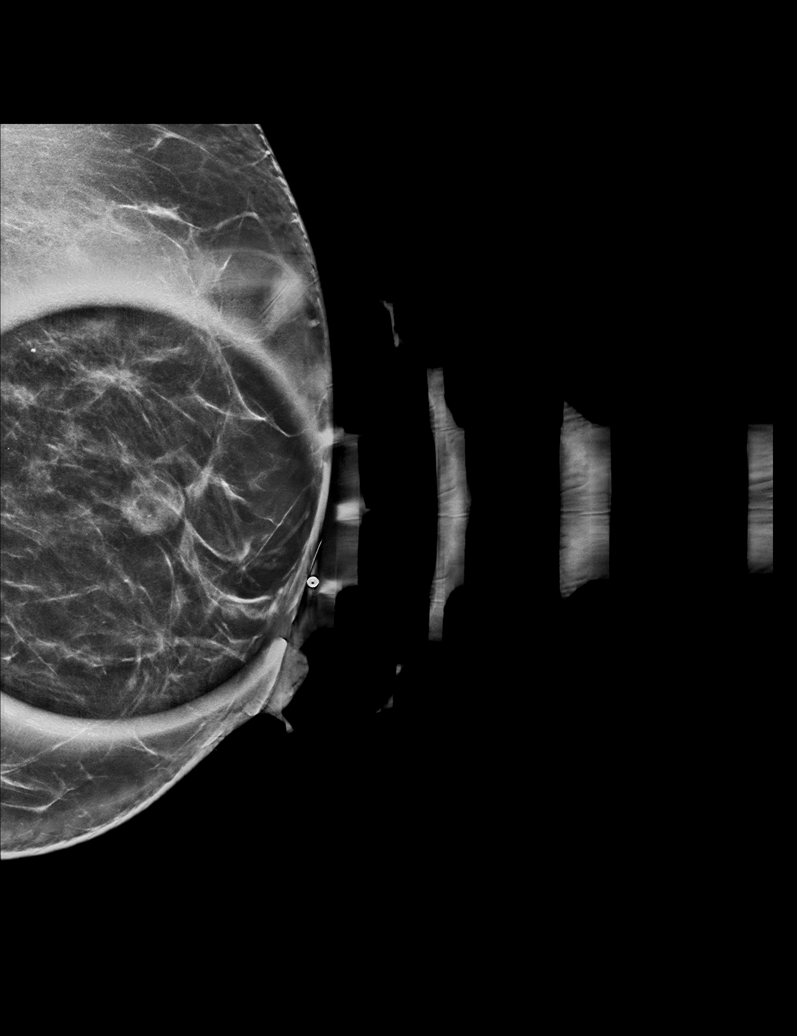

[L MLO synth-2D (2 of 2)]
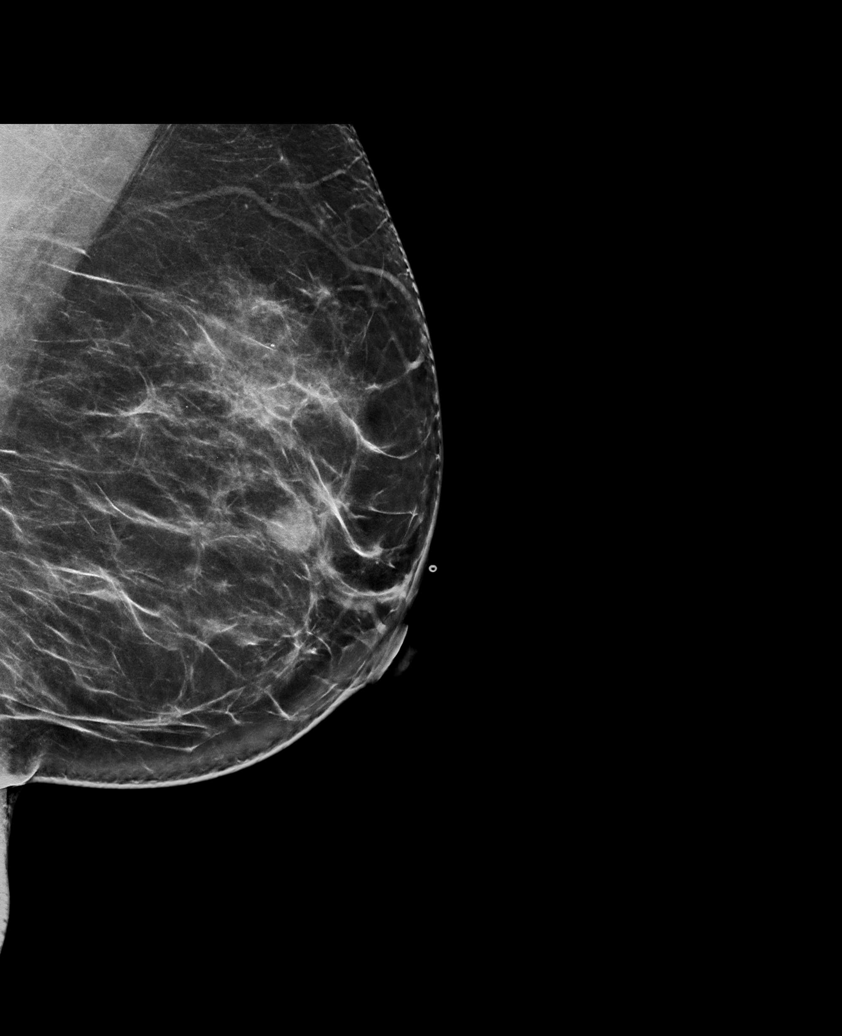

[5 of 30 positions shown; findings below may reference images not displayed]

ACR Breast Density Category b: There are scattered areas of
fibroglandular density.
FINDINGS: Tomosynthesis and synthesized full field CC and MLO views of both
breasts were obtained. Tomosynthesis and synthesized spot
compression tangential view of the area of concern in the LEFT
breast was also obtained.

Deep to the palpable concern in the OUTER subareolar LEFT breast is
a low-density mass with slightly lobular margins measuring
approximately 1.4 x 1.4 x 1.3 cm. There is no associated
architectural distortion or suspicious calcifications. No suspicious
findings elsewhere in the LEFT breast.

In the UPPER OUTER QUADRANT of the RIGHT breast at MIDDLE to
POSTERIOR depth is a circumscribed low-density mass measuring
approximately 1.3 x 1.2 x 1.1 cm. There is no associated
architectural distortion or suspicious calcifications. No suspicious
findings elsewhere in the RIGHT breast.

Mammographic images were processed with CAD.

On physical exam, there is a palpable superficial lump in the UPPER
OUTER portion of the LEFT areola corresponding to what the patient
is feeling.

Targeted LEFT breast ultrasound is performed, showing a hyperechoic
focus with a subcutaneous fat lobule at the 12 o'clock 1 cm from the
nipple measuring approximately 1.0 x 1.5 x 1.0 cm, demonstrating
adjacent power Doppler flow and no posterior characteristics,
corresponding to the palpable concern.

At the 11:30-12 o'clock position approximately 2 cm from the nipple
at MIDDLE depth is a hypoechoic mass with an angular LATERAL margin
and indistinct POSTERIOR margin measuring approximately 1.0 x 1.1 x
1.0 cm, demonstrating no posterior characteristics and no internal
power Doppler flow, corresponding to the mammographic finding.

Sonographic evaluation of the LEFT axilla demonstrates no pathologic
lymphadenopathy.

Targeted RIGHT breast ultrasound is performed, showing a
circumscribed oval parallel hypoechoic mass at the 10 o'clock
position approximately 7 cm from nipple measuring approximately
x 1.0 x 1.0 cm, demonstrating no posterior characteristics and no
internal power Doppler flow, corresponding to the mammographic
finding.
IMPRESSION: 1. Indeterminate approximate 1.1 cm mass involving the slight UPPER
OUTER QUADRANT of the LEFT breast at MIDDLE depth which corresponds
to a mammographic finding.
2. No pathologic LEFT axillary lymphadenopathy.
3. Likely benign fat necrosis involving the upper subareolar LEFT
breast which accounts for the patient's palpable concern.
4. Likely benign approximate 1 cm fibroadenoma involving the UPPER
OUTER QUADRANT of the RIGHT breast.

RECOMMENDATION:
1. Ultrasound-guided core needle biopsy of the indeterminate LEFT
breast mass.
2. If the LEFT breast biopsy returns as a benign fibroadenoma, then
six-month follow-up RIGHT breast ultrasound is recommended. If the
LEFT breast biopsy returns as a malignancy, then biopsy of the RIGHT
breast mass is recommended.
3. Follow-up LEFT breast ultrasound in 6 months to confirm the
expected evolution of the likely benign fat necrosis in the upper
subareolar location which accounts for the palpable concern.

I have discussed the findings and recommendations with the patient.
Communication with the patient was achieved with the assistance of a
certified interpreter. Results were also provided in writing at the
conclusion of the visit.

BI-RADS CATEGORY  4: Suspicious.

## 2019-06-01 ENCOUNTER — Inpatient Hospital Stay: Admission: RE | Admit: 2019-06-01 | Payer: BLUE CROSS/BLUE SHIELD | Source: Ambulatory Visit

## 2021-05-08 ENCOUNTER — Encounter: Payer: Self-pay | Admitting: Family Medicine

## 2021-05-08 ENCOUNTER — Other Ambulatory Visit: Payer: Self-pay

## 2021-05-08 ENCOUNTER — Ambulatory Visit (INDEPENDENT_AMBULATORY_CARE_PROVIDER_SITE_OTHER): Payer: Self-pay | Admitting: Family Medicine

## 2021-05-08 VITALS — BP 131/78 | HR 75 | Temp 97.5°F | Resp 20 | Ht 63.0 in | Wt 168.0 lb

## 2021-05-08 DIAGNOSIS — K0889 Other specified disorders of teeth and supporting structures: Secondary | ICD-10-CM

## 2021-05-08 DIAGNOSIS — K047 Periapical abscess without sinus: Secondary | ICD-10-CM

## 2021-05-08 MED ORDER — CLINDAMYCIN HCL 300 MG PO CAPS: 300.0000 mg | ORAL_CAPSULE | Freq: Three times a day (TID) | ORAL | 0 refills | Status: AC

## 2021-05-08 MED ORDER — KETOROLAC TROMETHAMINE 30 MG/ML IJ SOLN
30.0000 mg | Freq: Once | INTRAMUSCULAR | Status: AC
  Administered 2021-05-08: 30 mg via INTRAMUSCULAR

## 2021-05-08 NOTE — Progress Notes (Signed)
Subjective:  Patient ID: Toni Le, female    DOB: [birth year], 45 y.o.   MRN: [id number]  Patient Care Team: Sharion Balloon, FNP as PCP - General (Family Medicine)   Chief Complaint:  Dental Pain   HPI: Toni Le is a 45 y.o. female presenting on 05/08/2021 for Dental Pain   Dental Pain  This is a new problem. The current episode started more than 1 month ago. The problem occurs constantly. The problem has been gradually worsening. The pain is at a severity of 7/10. The pain is moderate. Associated symptoms include thermal sensitivity. Pertinent negatives include no difficulty swallowing, facial pain, fever, oral bleeding or sinus pressure. She has tried acetaminophen and NSAIDs for the symptoms. The treatment provided no relief.    Relevant past medical, surgical, family, and social history reviewed and updated as indicated.  Allergies and medications reviewed and updated. Data reviewed: Chart in Epic.   History reviewed. No pertinent past medical history.  Past Surgical History:  Procedure Laterality Date   CESAREAN SECTION      Social History   Socioeconomic History   Marital status: Married    Spouse name: Not on file   Number of children: Not on file   Years of education: Not on file   Highest education level: Not on file  Occupational History   Not on file  Tobacco Use   Smoking status: Never   Smokeless tobacco: Never  Substance and Sexual Activity   Alcohol use: No    Alcohol/week: 0.0 standard drinks   Drug use: No   Sexual activity: Yes  Other Topics Concern   Not on file  Social History Narrative   Not on file   Social Determinants of Health   Financial Resource Strain: Not on file  Food Insecurity: Not on file  Transportation Needs: Not on file  Physical Activity: Not on file  Stress: Not on file  Social Connections: Not on file  Intimate Partner Violence: Not on file    Outpatient Encounter Medications as of  05/08/2021  Medication Sig   clindamycin (CLEOCIN) 300 MG capsule Take 1 capsule (300 mg total) by mouth 3 (three) times daily for 7 days.   [EXPIRED] ketorolac (TORADOL) 30 MG/ML injection 30 mg    No facility-administered encounter medications on file as of 05/08/2021.    Allergies  Allergen Reactions   Penicillins     Review of Systems  Constitutional:  Negative for activity change, appetite change, chills, diaphoresis, fatigue, fever and unexpected weight change.  HENT:  Positive for dental problem and facial swelling. Negative for congestion, drooling, ear discharge, ear pain, hearing loss, mouth sores, nosebleeds, postnasal drip, rhinorrhea, sinus pressure, sinus pain, sneezing, sore throat, tinnitus, trouble swallowing and voice change.   Eyes: Negative.   Respiratory:  Negative for cough, chest tightness and shortness of breath.   Cardiovascular:  Negative for chest pain, palpitations and leg swelling.  Gastrointestinal:  Negative for abdominal pain, blood in stool, constipation, diarrhea, nausea and vomiting.  Endocrine: Negative.   Genitourinary:  Negative for dysuria, frequency and urgency.  Musculoskeletal:  Negative for arthralgias and myalgias.  Skin: Negative.   Allergic/Immunologic: Negative.   Neurological:  Negative for dizziness, weakness and headaches.  Hematological: Negative.   Psychiatric/Behavioral:  Negative for confusion, hallucinations, sleep disturbance and suicidal ideas.   All other systems reviewed and are negative.      Objective:  BP 131/78   Pulse 75   Temp Marland Kitchen)  97.5 F (36.4 C) (Temporal)   Resp 20   Ht 5' 3"  (1.6 m)   Wt 168 lb (76.2 kg)   SpO2 98%   BMI 29.76 kg/m    Wt Readings from Last 3 Encounters:  05/08/21 168 lb (76.2 kg)  07/21/18 169 lb 6.4 oz (76.8 kg)  10/03/17 171 lb (77.6 kg)    Physical Exam Vitals and nursing note reviewed.  Constitutional:      General: She is not in acute distress.    Appearance: Normal  appearance. She is well-developed and well-groomed. She is obese. She is not ill-appearing, toxic-appearing or diaphoretic.  HENT:     Head: Normocephalic and atraumatic.     Jaw: There is normal jaw occlusion.     Right Ear: Hearing normal.     Left Ear: Hearing normal.     Nose: Nose normal.     Mouth/Throat:     Lips: Pink.     Mouth: Mucous membranes are moist. No injury, lacerations, oral lesions or angioedema.     Dentition: Abnormal dentition. Does not have dentures. Dental tenderness, gingival swelling, dental caries and dental abscesses present. No gum lesions.     Tongue: No lesions. Tongue does not deviate from midline.     Palate: No mass and lesions.     Pharynx: Oropharynx is clear. Uvula midline.   Eyes:     General: Lids are normal.     Extraocular Movements: Extraocular movements intact.     Conjunctiva/sclera: Conjunctivae normal.     Pupils: Pupils are equal, round, and reactive to light.  Neck:     Thyroid: No thyroid mass, thyromegaly or thyroid tenderness.     Vascular: No carotid bruit or JVD.     Trachea: Trachea and phonation normal.  Cardiovascular:     Rate and Rhythm: Normal rate and regular rhythm.     Chest Wall: PMI is not displaced.     Pulses: Normal pulses.     Heart sounds: Normal heart sounds. No murmur heard.   No friction rub. No gallop.  Pulmonary:     Effort: Pulmonary effort is normal. No respiratory distress.     Breath sounds: Normal breath sounds. No wheezing.  Abdominal:     General: Bowel sounds are normal. There is no distension or abdominal bruit.     Palpations: Abdomen is soft. There is no hepatomegaly or splenomegaly.     Tenderness: There is no abdominal tenderness. There is no right CVA tenderness or left CVA tenderness.     Hernia: No hernia is present.  Musculoskeletal:        General: Normal range of motion.     Cervical back: Normal range of motion and neck supple.     Right lower leg: No edema.     Left lower leg: No  edema.  Lymphadenopathy:     Cervical: No cervical adenopathy.  Skin:    General: Skin is warm and dry.     Capillary Refill: Capillary refill takes less than 2 seconds.     Coloration: Skin is not cyanotic, jaundiced or pale.     Findings: No rash.  Neurological:     General: No focal deficit present.     Mental Status: She is alert and oriented to person, place, and time.     Cranial Nerves: Cranial nerves are intact. No cranial nerve deficit.     Sensory: Sensation is intact. No sensory deficit.     Motor: Motor function  is intact. No weakness.     Coordination: Coordination is intact. Coordination normal.     Gait: Gait is intact. Gait normal.     Deep Tendon Reflexes: Reflexes are normal and symmetric. Reflexes normal.  Psychiatric:        Attention and Perception: Attention and perception normal.        Mood and Affect: Mood and affect normal.        Speech: Speech normal.        Behavior: Behavior normal. Behavior is cooperative.        Thought Content: Thought content normal.        Cognition and Memory: Cognition and memory normal.        Judgment: Judgment normal.    Results for orders placed or performed in visit on 10/03/17  Microscopic Examination   URINE  Result Value Ref Range   WBC, UA 0-5 0 - 5 /hpf   RBC, UA 11-30 (A) 0 - 2 /hpf   Epithelial Cells (non renal) 0-10 0 - 10 /hpf   Renal Epithel, UA None seen None seen /hpf   Mucus, UA Present Not Estab.   Bacteria, UA Moderate (A) None seen/Few   Yeast, UA Present None seen  Urinalysis, Complete  Result Value Ref Range   Specific Gravity, UA 1.020 1.005 - 1.030   pH, UA 7.0 5.0 - 7.5   Color, UA Yellow Yellow   Appearance Ur Clear Clear   Leukocytes, UA Negative Negative   Protein, UA 1+ (A) Negative/Trace   Glucose, UA Negative Negative   Ketones, UA Negative Negative   RBC, UA 3+ (A) Negative   Bilirubin, UA Negative Negative   Urobilinogen, Ur 0.2 0.2 - 1.0 mg/dL   Nitrite, UA Negative Negative    Microscopic Examination See below:   Anemia Profile B  Result Value Ref Range   Total Iron Binding Capacity 381 250 - 450 ug/dL   UIBC 316 131 - 425 ug/dL   Iron 65 27 - 159 ug/dL   Iron Saturation 17 15 - 55 %   Ferritin 23 15 - 150 ng/mL   Vitamin B-12 504 232 - 1,245 pg/mL   Folate 13.2 >3.0 ng/mL   WBC 7.4 3.4 - 10.8 x10E3/uL   RBC 4.85 3.77 - 5.28 x10E6/uL   Hemoglobin 12.5 11.1 - 15.9 g/dL   Hematocrit 39.2 34.0 - 46.6 %   MCV 81 79 - 97 fL   MCH 25.8 (L) 26.6 - 33.0 pg   MCHC 31.9 31.5 - 35.7 g/dL   RDW 14.9 12.3 - 15.4 %   Platelets 314 150 - 379 x10E3/uL   Neutrophils 69 Not Estab. %   Lymphs 23 Not Estab. %   Monocytes 7 Not Estab. %   Eos 1 Not Estab. %   Basos 0 Not Estab. %   Neutrophils Absolute 5.1 1.4 - 7.0 x10E3/uL   Lymphocytes Absolute 1.7 0.7 - 3.1 x10E3/uL   Monocytes Absolute 0.6 0.1 - 0.9 x10E3/uL   EOS (ABSOLUTE) 0.1 0.0 - 0.4 x10E3/uL   Basophils Absolute 0.0 0.0 - 0.2 x10E3/uL   Immature Granulocytes 0 Not Estab. %   Immature Grans (Abs) 0.0 0.0 - 0.1 x10E3/uL   Retic Ct Pct 2.3 0.6 - 2.6 %  CMP14+EGFR  Result Value Ref Range   Glucose 93 65 - 99 mg/dL   BUN 7 6 - 24 mg/dL   Creatinine, Ser 0.64 0.57 - 1.00 mg/dL   GFR calc non Af Amer 111 >  59 mL/min/1.73   GFR calc Af Amer 128 >59 mL/min/1.73   BUN/Creatinine Ratio 11 9 - 23   Sodium 141 134 - 144 mmol/L   Potassium 3.8 3.5 - 5.2 mmol/L   Chloride 101 96 - 106 mmol/L   CO2 26 20 - 29 mmol/L   Calcium 9.0 8.7 - 10.2 mg/dL   Total Protein 7.4 6.0 - 8.5 g/dL   Albumin 4.2 3.5 - 5.5 g/dL   Globulin, Total 3.2 1.5 - 4.5 g/dL   Albumin/Globulin Ratio 1.3 1.2 - 2.2   Bilirubin Total 0.3 0.0 - 1.2 mg/dL   Alkaline Phosphatase 84 39 - 117 IU/L   AST 22 0 - 40 IU/L   ALT 22 0 - 32 IU/L  Lipid panel  Result Value Ref Range   Cholesterol, Total 166 100 - 199 mg/dL   Triglycerides 221 (H) 0 - 149 mg/dL   HDL 33 (L) >39 mg/dL   VLDL Cholesterol Cal 44 (H) 5 - 40 mg/dL   LDL Calculated 89 0 - 99  mg/dL   Chol/HDL Ratio 5.0 (H) 0.0 - 4.4 ratio  TSH  Result Value Ref Range   TSH 2.090 0.450 - 4.500 uIU/mL  VITAMIN D 25 Hydroxy (Vit-D Deficiency, Fractures)  Result Value Ref Range   Vit D, 25-Hydroxy 22.4 (L) 30.0 - 100.0 ng/mL  Pap IG w/ reflex to HPV when ASC-U  Result Value Ref Range   DIAGNOSIS: Comment    Specimen adequacy: Comment    Clinician Provided ICD10 Comment    Performed by: Comment    PAP Smear Comment .    Note: Comment    Test Methodology Comment    PAP Reflex Comment        Pertinent labs & imaging results that were available during my care of the patient were reviewed by me and considered in my medical decision making.  Assessment & Plan:  Toni Le was seen today for dental pain.  Diagnoses and all orders for this visit:  Dental abscess Toothache Dental abscess. Pt aware to make an appointment to see a dentist as soon as possible. Allergy to PCN so will prescribe Cleocin for 7 days. Tylenol and motrin as needed for pain control. Toradol given in office today as pt was in a moderate amount of discomfort.  -     clindamycin (CLEOCIN) 300 MG capsule; Take 1 capsule (300 mg total) by mouth 3 (three) times daily for 7 days. -     ketorolac (TORADOL) 30 MG/ML injection 30 mg    Continue all other maintenance medications.  Follow up plan: Return if symptoms worsen or fail to improve.   Continue healthy lifestyle choices, including diet (rich in fruits, vegetables, and lean proteins, and low in salt and simple carbohydrates) and exercise (at least 30 minutes of moderate physical activity daily).  Educational handout given for dental abscess  The above assessment and management plan was discussed with the patient. The patient verbalized understanding of and has agreed to the management plan. Patient is aware to call the clinic if they develop any new symptoms or if symptoms persist or worsen. Patient is aware when to return to the clinic for a follow-up visit.  Patient educated on when it is appropriate to go to the emergency department.   Monia Pouch, FNP-C [facility] [phone]

## 2021-09-18 ENCOUNTER — Ambulatory Visit (INDEPENDENT_AMBULATORY_CARE_PROVIDER_SITE_OTHER): Payer: BC Managed Care – PPO | Admitting: Family

## 2021-09-18 ENCOUNTER — Encounter: Payer: Self-pay | Admitting: Family

## 2021-09-18 VITALS — BP 126/83 | HR 97 | Temp 97.3°F | Ht 63.0 in | Wt 165.2 lb

## 2021-09-18 DIAGNOSIS — Z0000 Encounter for general adult medical examination without abnormal findings: Secondary | ICD-10-CM

## 2021-09-18 DIAGNOSIS — E781 Pure hyperglyceridemia: Secondary | ICD-10-CM | POA: Diagnosis not present

## 2021-09-18 DIAGNOSIS — Z113 Encounter for screening for infections with a predominantly sexual mode of transmission: Secondary | ICD-10-CM | POA: Diagnosis not present

## 2021-09-18 DIAGNOSIS — R079 Chest pain, unspecified: Secondary | ICD-10-CM | POA: Diagnosis not present

## 2021-09-18 DIAGNOSIS — E559 Vitamin D deficiency, unspecified: Secondary | ICD-10-CM | POA: Diagnosis not present

## 2021-09-18 DIAGNOSIS — E669 Obesity, unspecified: Secondary | ICD-10-CM

## 2021-09-18 DIAGNOSIS — I4892 Unspecified atrial flutter: Secondary | ICD-10-CM

## 2021-09-18 DIAGNOSIS — F411 Generalized anxiety disorder: Secondary | ICD-10-CM

## 2021-09-18 DIAGNOSIS — Z1159 Encounter for screening for other viral diseases: Secondary | ICD-10-CM

## 2021-09-18 DIAGNOSIS — Z0001 Encounter for general adult medical examination with abnormal findings: Secondary | ICD-10-CM

## 2021-09-18 NOTE — Patient Instructions (Addendum)
Trastorno de ansiedad generalizada, en adultos Generalized Anxiety Disorder, Adult El trastorno de ansiedad generalizada (TAG) es una afeccin de salud mental. A diferencia de las preocupaciones normales, la ansiedad relacionada con el TAG no se desencadena por un acontecimiento especfico. Estas preocupaciones no desaparecen ni mejoran con el tiempo. EL TAG interfiere con las relaciones, el trabajo y [place]. Los sntomas del TAG pueden variar de leves a graves. Las personas con TAG grave pueden tener intensas oleadas de ansiedad con sntomas fsicos que son similares a las crisis de [place]. Cules son las causas? Se desconoce la causa exacta del TAG, pero se cree que influyen los siguientes factores: Diferencias en las sustancias qumicas naturales del cerebro. Genes que se transmiten de padres a hijos. Diferencias en la forma en que se perciben las amenazas. Desarrollo y Psychologist, forensic durante la infancia. La personalidad. Qu incrementa el riesgo? Los siguientes factores pueden hacer que sea ms propenso a Armed forces training and education officer afeccin: Ser mujer. Tener antecedentes familiares de trastornos de ansiedad. Ser muy tmido. Experimentar acontecimientos muy estresantes en la vida, como la muerte de un ser querido. Tener un Industrial/product designer. Cules son los signos o sntomas? Con frecuencia, las personas que padecen el TAG se preocupan excesivamente por muchas cosas en la vida, tales como su salud y [place]. Otros sntomas son: Heritage manager y emocionales: Preocupacin excesiva acerca de los desastres naturales. [place] a llegar tarde. Dificultad para concentrarse. Temor de que otras personas juzguen su desempeo. Sntomas fsicos: Fatiga. Dolores de cabeza, tensin muscular, contracciones musculares, temblores o sensacin de tambalearse. Sensacin de que el corazn late [place] rpido. Sentir falta de aire o como que no se puede respirar profundamente. Problemas para  conciliar el sueo o para seguir durmiendo, o sensacin de Air cabin crew. Sudoracin. Nuseas, diarrea o sndrome de colon irritable (SCI). Sntomas de la conducta: Tener estados de nimo cambiantes o irritabilidad. Evitar situaciones nuevas. Evitar a Public affairs consultant. Dificultad extrema para tomar decisiones. Cmo se diagnostica? Esta afeccin se diagnostica en funcin de los sntomas y los antecedentes mdicos. Adems, se le Chartered certified accountant examen fsico. El mdico puede hacerle algunas pruebas para descartar que sus sntomas tengan otras causas. Para recibir un diagnstico del TAG, una persona debe tener ansiedad que: Est fuera de su control. Afecte distintos aspectos de su vida, como el trabajo y [employer]. Cause angustia que le impida participar en sus actividades habituales. Incluya al menos tres sntomas del TAG, tales como desasosiego, fatiga, dificultad para concentrarse, irritabilidad, tensin muscular o problemas para dormir. Antes de que su mdico pueda confirmar el diagnstico de TAG, estos sntomas deben estar presentes ms [employer] no lo estn y deben tener una duracin de seis meses o ms. Cmo se trata? El tratamiento de esta afeccin puede incluir: Medicamentos. Por lo general, los medicamentos antidepresivos se recetan para un control diario a Barrister's clerk. Se pueden agregar medicamentos para la ansiedad en casos graves, especialmente cuando ocurren crisis de [place]. Psicoterapia (psicoanlisis). Determinados tipos de psicoterapia pueden ser tiles para tratar el TAG al brindar 61, educacin y orientacin. [place] opciones se incluyen las siguientes: Terapia cognitivo conductual (TCC). Las personas aprenden habilidades para afrontar situaciones y tcnicas para poder calmarse para aliviar sus sntomas fsicos. Aprenden a identificar comportamientos y pensamientos no realistas y a reemplazarlos por comportamientos y pensamientos ms adecuados. Terapia de aceptacin y  compromiso (acceptance and commitment therapy, ACT). Este tratamiento ensea a las personas a ser conscientes como una forma de lidiar con pensamientos y  sentimientos no deseados. Biorretroalimentacin. Este proceso lo capacita para Aeronautical engineer respuesta del cuerpo Insurance risk surveyor) a travs de tcnicas de respiracin y mtodos de relajacin. Usted trabajar con un terapeuta mientras se usan mquinas para controlar sus sntomas fsicos. Tcnicas para Scientific laboratory technician. Estas incluyen yoga, meditacin y ejercicio. Un especialista en salud mental puede ayudar a determinar qu tratamiento es el mejor para usted. Algunas Water quality scientist con solo un tipo de [place]. Sin embargo, Producer, television/film/video requieren una combinacin de terapias. Siga estas instrucciones en su casa: Estilo de vida Mantenga horarios y [place] rutina uniforme. [place] situaciones estresantes. Michelene Gardener un plan y reserve tiempo adicional para trabajar con su plan. Practique tcnicas de control del estrs o tcnicas para calmarse que su terapeuta o su mdico le haya enseado. Haga actividad fsica con frecuencia y pase tiempo al [employer]. Siga una dieta saludable que incluya abundantes frutas, verduras, cereales integrales, productos lcteos descremados y protenas magras. No consuma muchos alimentos con alto contenido de grasas, azcar agregado o sal (sodio). Beba abundante agua. Evite tomar alcohol. El alcohol puede aumentar la ansiedad. Evite el consumo de cafena y ciertos medicamentos de venta libre contra el resfro. Estos podran Engineer, building services. Pregntele al farmacutico qu medicamentos no debera tomar. Instrucciones generales Use los medicamentos de venta libre y los recetados solamente como se lo haya indicado el mdico. Comprenda que es probable que tenga retrocesos. Acepte esto y sea amable con usted mismo mientras contina cuidndose mejor. [place] situaciones estresantes. Michelene Gardener un plan y reserve  tiempo adicional para trabajar con su plan. Reconozca y acepte sus logros, aunque le parezcan pequeos. Pase tiempo con personas que se preocupan por usted. Concurra a [place]. Esto es importante. Dnde obtener ms informacin [place] ([place] Mental): https://carter.com/ Substance Abuse and [place] ([place] por [place] y [place]): ktimeonline.com Comunquese con un mdico si: Los sntomas no mejoran. Los sntomas empeoran. Tiene signos de depresin tales como: Un estado de nimo constantemente triste o irritable. Ya no disfruta de Engineering geologist. Cambios en el peso o en sus hbitos de alimentacin. Cambios en los hbitos de sueo. Solicite ayuda de inmediato si: Tiene pensamientos acerca de lastimarse a usted mismo o a Producer, television/film/video. Si alguna vez siente que puede lastimarse o Market researcher a Producer, television/film/video, o tiene pensamientos de poner fin a su vida, busque ayuda de inmediato. Dirjase al servicio de urgencias ms cercano o: Comunquese con el servicio de emergencias de su localidad (911 en los [place]). Llame a una lnea de asistencia al suicida y atencin en crisis como National Suicide Prevention Lifeline ([place]) al [phone]. Est disponible las 24 horas del da en los [place] U[place]. Enve un mensaje de texto a la lnea para casos de crisis al [phone] (en los EE. UU.). Resumen El trastorno de ansiedad generalizada (TAG) es una afeccin de salud mental que implica preocupacin no desencadenada por un acontecimiento especfico. Con frecuencia, las personas que padecen el TAG se preocupan excesivamente por muchas cosas en la vida, tales como su salud y [place]. El TAG puede causar sntomas tales como agitacin, dificultad para concentrarse, problemas para dormir, sudoracin frecuente, nuseas, diarrea, dolores de [place] y  temblores o contracciones musculares. Un especialista en salud mental puede ayudar a determinar qu tratamiento es el mejor para usted. Algunas Water quality scientist con solo un tipo de [place].  Sin embargo, Producer, television/film/video requieren una combinacin de terapias. Esta informacin no tiene Marine scientist el consejo del mdico. Asegrese de hacerle al mdico cualquier pregunta que tenga. Document Revised: 01/15/2021 Document Reviewed: 01/15/2021 Elsevier Patient Education  2022 Olds Maintenance, Female Adoptar un estilo de vida saludable y recibir atencin preventiva son importantes para promover la salud y Musician. Consulte al mdico sobre: El esquema adecuado para hacerse pruebas y exmenes peridicos. Cosas que puede hacer por su cuenta para prevenir enfermedades y [place] sano. Qu debo saber sobre la dieta, el peso y el ejercicio? Consuma una dieta saludable  Consuma una dieta que incluya muchas verduras, frutas, productos lcteos con bajo contenido de [place] y Advertising account planner. No consuma muchos alimentos ricos en grasas slidas, azcares agregados o sodio. Mantenga un peso saludable El ndice de masa muscular [facility]) se [place] and the [place] para identificar problemas de [place]. Proporciona una estimacin de la grasa corporal basndose en el peso y la altura. Su mdico puede ayudarle a Radiation protection practitioner [place] y a Scientist, forensic o Theatre manager un peso saludable. Haga ejercicio con regularidad Haga ejercicio con regularidad. Esta es una de las prcticas ms importantes que puede hacer por su salud. La [employer] de los adultos deben seguir estas pautas: Optometrist, al menos, 150 minutos de actividad fsica por semana. El ejercicio debe aumentar la frecuencia cardaca y Nature conservation officer transpirar (ejercicio de intensidad moderada). Hacer ejercicios de fortalecimiento por lo [employer] por semana. Agregue esto a su plan de ejercicio de intensidad moderada. Pase menos  tiempo sentada. Incluso la actividad fsica ligera puede ser beneficiosa. Controle sus niveles de colesterol y lpidos en la sangre Comience a realizarse anlisis de lpidos y Research officer, trade union en la sangre a los 3 aos y luego reptalos cada 5 aos. Hgase controlar los niveles de colesterol con mayor frecuencia si: Sus niveles de lpidos y colesterol son altos. Es mayor de 25 aos. Presenta un alto riesgo de padecer enfermedades cardacas. Qu debo saber sobre las pruebas de deteccin del cncer? Segn su historia clnica y sus antecedentes familiares, es posible que deba realizarse pruebas de deteccin del cncer en diferentes edades. Esto puede incluir pruebas de deteccin de lo siguiente: Cncer de mama. Cncer de cuello uterino. Cncer colorrectal. Cncer de piel. Cncer de pulmn. Qu debo saber sobre la enfermedad cardaca, la diabetes y la hipertensin arterial? Presin arterial y enfermedad cardaca La hipertensin arterial causa enfermedades cardacas y [place] el riesgo de accidente cerebrovascular. Es ms probable que esto se manifieste en las personas que tienen lecturas de presin arterial alta o tienen sobrepeso. Hgase controlar la presin arterial: Cada 3 a 5 aos si tiene entre 18 y 63 aos. Todos los aos si es mayor de 40 aos. Diabetes Realcese exmenes de deteccin de la diabetes con regularidad. Este anlisis revisa el nivel de azcar en la sangre en [place]. Hgase las pruebas de deteccin: Cada tres aos despus de los 11 aos de edad si tiene un peso normal y un bajo riesgo de padecer diabetes. Con ms frecuencia y a partir de [place] edad inferior si tiene sobrepeso o un alto riesgo de padecer diabetes. Qu debo saber sobre la prevencin de infecciones? Hepatitis B Si tiene un riesgo ms alto de contraer hepatitis B, debe someterse a un examen de deteccin de este virus. Hable con el mdico para averiguar si tiene riesgo de contraer la infeccin por hepatitis  B. Hepatitis C Se recomienda el anlisis a: [employer] nacieron  entre 1945 y 44. Todas las personas que tengan un riesgo de haber contrado hepatitis C. Enfermedades de transmisin sexual (ETS) Hgase las pruebas de Programme researcher, broadcasting/film/video de ITS, incluidas la gonorrea y la clamidia, si: Es sexualmente activa y es menor de 37 aos. Es mayor de 33 aos, y Investment banker, operational informa que corre riesgo de tener este tipo de infecciones. La actividad sexual ha cambiado desde que le hicieron la ltima prueba de deteccin y tiene un riesgo mayor de Best boy clamidia o Radio broadcast assistant. Pregntele al mdico si usted tiene riesgo. Pregntele al mdico si usted tiene un alto riesgo de Museum/gallery curator VIH. El mdico tambin puede recomendarle un medicamento recetado para ayudar a evitar la infeccin por el VIH. Si elige tomar medicamentos para prevenir el VIH, primero debe [employer] de deteccin del VIH. Luego debe hacerse anlisis cada 3 meses mientras est tomando los medicamentos. Embarazo Si est por dejar de Librarian, academic (fase premenopusica) y usted puede quedar Stella, busque asesoramiento antes de [place]. Tome de 400 a 800 microgramos (mcg) de cido [employer] si [place]. Pida mtodos de control de la natalidad (anticonceptivos) si desea evitar un embarazo no deseado. Osteoporosis y [place] La osteoporosis es una enfermedad en la que los huesos pierden los minerales y la fuerza por el avance de la edad. El resultado pueden ser fracturas en los [place]. Si tiene 88 aos o ms, o si est en riesgo de sufrir osteoporosis y fracturas, pregunte a su mdico si debe: Hacerse pruebas de deteccin de prdida sea. Tomar un suplemento de calcio o de vitamina D para reducir el riesgo de fracturas. Recibir terapia de reemplazo hormonal (TRH) para tratar los sntomas de la menopausia. Siga estas indicaciones en su casa: Consumo de alcohol No beba alcohol si: Su mdico le indica no hacerlo. Est  embarazada, puede estar embarazada o est tratando de [place]. Si bebe alcohol: Limite la cantidad que bebe a lo siguiente: De 0 a 1 bebida por da. Sepa cunta cantidad de alcohol hay en las bebidas que toma. En los [place], una medida equivale a una botella de cerveza de 12 oz (355 ml), un vaso de vino de 5 oz (148 ml) o un vaso de una bebida alcohlica de alta graduacin de 1 oz (44 ml). Estilo de vida No consuma ningn producto que contenga nicotina o tabaco. Estos productos incluyen cigarrillos, tabaco para Higher education careers adviser y aparatos de vapeo, como los Psychologist, sport and exercise. Si necesita ayuda para dejar de consumir estos productos, consulte al mdico. No consuma drogas. No comparta agujas. Solicite ayuda a su mdico si necesita apoyo o informacin para abandonar las drogas. Indicaciones generales Realcese los estudios de rutina de la salud, dentales y de Public librarian. [place]. Infrmele a su mdico si: Se siente deprimida con frecuencia. Alguna vez ha sido vctima de [place] o no se siente seguro en su casa. Resumen Adoptar un estilo de vida saludable y recibir atencin preventiva son importantes para promover la salud y Musician. Siga las instrucciones del mdico acerca de una dieta saludable, el ejercicio y la realizacin de pruebas o exmenes para Engineer, building services. Siga las instrucciones del mdico con respecto al control del colesterol y la presin arterial. Esta informacin no tiene Marine scientist el consejo del mdico. Asegrese de hacerle al mdico cualquier pregunta que tenga. Document Revised: 02/12/2021 Document Reviewed: 02/12/2021 Elsevier Patient Education  [place].

## 2021-09-18 NOTE — Progress Notes (Signed)
Subjective:    Patient ID: Toni Le, female    DOB: 18-Jul-1976, 45 y.o.   MRN: [id number]   Chief Complaint  Patient presents with   Annual Exam    With pap    PT presents to the office today for CPE with pap.  Hyperlipidemia This is a chronic problem. The current episode started more than 1 year ago. The problem is uncontrolled. Exacerbating diseases include obesity. Associated symptoms include chest pain. Current antihyperlipidemic treatment includes diet change. The current treatment provides mild improvement of lipids. Risk factors for coronary artery disease include dyslipidemia, hypertension, a sedentary lifestyle and post-menopausal.  Gynecologic Exam The patient's pertinent negatives include no genital itching, vaginal bleeding or vaginal discharge. This is a chronic problem. Pertinent negatives include no back pain.  Chest Pain  This is a new problem. The current episode started 1 to 4 weeks ago. The onset quality is gradual. The pain is present in the substernal region. The pain is at a severity of 3/10. The pain is mild. The quality of the pain is described as dull. The pain does not radiate. Pertinent negatives include no back pain, claudication, cough, dizziness, irregular heartbeat, lower extremity edema or malaise/fatigue.  Her past medical history is significant for hyperlipidemia.     Review of Systems  Constitutional:  Negative for malaise/fatigue.  Respiratory:  Negative for cough.   Cardiovascular:  Positive for chest pain. Negative for claudication.  Genitourinary:  Negative for vaginal discharge.  Musculoskeletal:  Negative for back pain.  Neurological:  Negative for dizziness.  All other systems reviewed and are negative.  Family History  Problem Relation Age of Onset   Asthma Mother    Healthy Father    Social History   Socioeconomic History   Marital status: Married    Spouse name: Not on file   Number of children: Not on file   Years of  education: Not on file   Highest education level: Not on file  Occupational History   Not on file  Tobacco Use   Smoking status: Never   Smokeless tobacco: Never  Substance and Sexual Activity   Alcohol use: No    Alcohol/week: 0.0 standard drinks   Drug use: No   Sexual activity: Yes  Other Topics Concern   Not on file  Social History Narrative   Not on file   Social Determinants of Health   Financial Resource Strain: Not on file  Food Insecurity: Not on file  Transportation Needs: Not on file  Physical Activity: Not on file  Stress: Not on file  Social Connections: Not on file       Objective:   Physical Exam Vitals reviewed.  Constitutional:      General: She is not in acute distress.    Appearance: She is well-developed.  HENT:     Head: Normocephalic and atraumatic.     Right Ear: Tympanic membrane normal.     Left Ear: Tympanic membrane normal.  Eyes:     Pupils: Pupils are equal, round, and reactive to light.  Neck:     Thyroid: No thyromegaly.  Cardiovascular:     Rate and Rhythm: Normal rate and regular rhythm.     Heart sounds: Normal heart sounds. No murmur heard. Pulmonary:     Effort: Pulmonary effort is normal. No respiratory distress.     Breath sounds: Normal breath sounds. No wheezing.  Chest:  Breasts:    Right: No swelling, bleeding, inverted nipple, mass,  nipple discharge, skin change or tenderness.     Left: No swelling, bleeding, inverted nipple, mass, nipple discharge, skin change or tenderness.  Abdominal:     General: Bowel sounds are normal. There is no distension.     Palpations: Abdomen is soft.     Tenderness: There is no abdominal tenderness.  Genitourinary:    Comments: Bimanual exam- no adnexal masses or tenderness, ovaries nonpalpable   Cervix parous and pink- No discharge  Musculoskeletal:        General: No tenderness. Normal range of motion.     Cervical back: Normal range of motion and neck supple.  Skin:     General: Skin is warm and dry.  Neurological:     Mental Status: She is alert and oriented to person, place, and time.     Cranial Nerves: No cranial nerve deficit.     Deep Tendon Reflexes: Reflexes are normal and symmetric.  Psychiatric:        Behavior: Behavior normal.        Thought Content: Thought content normal.        Judgment: Judgment normal.      BP 126/83    Pulse 97    Temp (!) 97.3 F (36.3 C) (Temporal)    Ht 5' 3"  (1.6 m)    Wt 165 lb 3.2 oz (74.9 kg)    BMI 29.26 kg/m      Assessment & Plan:  Jennilee Demarco comes in today with chief complaint of Annual Exam (With pap/)   Diagnosis and orders addressed:  1. Annual physical exam - CMP14+EGFR - CBC with Differential/Platelet - Lipid panel - TSH - Hepatitis C antibody - VITAMIN D 25 Hydroxy (Vit-D Deficiency, Fractures) - HIV antibody (with reflex) - IGP,CtNgTv,Apt HPV  2. Hypertriglyceridemia - CMP14+EGFR - CBC with Differential/Platelet  3. Obesity (BMI 30-39.9) - CMP14+EGFR - CBC with Differential/Platelet  4. Vitamin D deficiency - CMP14+EGFR - CBC with Differential/Platelet - VITAMIN D 25 Hydroxy (Vit-D Deficiency, Fractures)  5. Need for hepatitis C screening test - CMP14+EGFR - CBC with Differential/Platelet - Hepatitis C antibody  6. Chest pain, unspecified type - EKG 12-Lead   7. GAD (generalized anxiety disorder)  8. Atrial flutter, unspecified type [facility]) - Ambulatory referral to Cardiology    Labs pending Health Maintenance reviewed Diet and exercise encouraged  Follow up plan: 1 year   Evelina Dun, FNP

## 2021-09-19 LAB — LIPID PANEL
Chol/HDL Ratio: 4.9 ratio — ABNORMAL HIGH (ref 0.0–4.4)
Cholesterol, Total: 167 mg/dL (ref 100–199)
HDL: 34 mg/dL — ABNORMAL LOW (ref >39)
LDL Chol Calc (NIH): 88 mg/dL (ref 0–99)
Triglycerides: 267 mg/dL — ABNORMAL HIGH (ref 0–149)
VLDL Cholesterol Cal: 45 mg/dL — ABNORMAL HIGH (ref 5–40)

## 2021-09-19 LAB — CMP14+EGFR
ALT: 19 IU/L (ref 0–32)
AST: 23 IU/L (ref 0–40)
Albumin/Globulin Ratio: 1.3 (ref 1.2–2.2)
Albumin: 4.2 g/dL (ref 3.8–4.8)
Alkaline Phosphatase: 90 IU/L (ref 44–121)
BUN/Creatinine Ratio: 12 (ref 9–23)
BUN: 7 mg/dL (ref 6–24)
Bilirubin Total: 0.3 mg/dL (ref 0.0–1.2)
CO2: 24 mmol/L (ref 20–29)
Calcium: 9 mg/dL (ref 8.7–10.2)
Chloride: 98 mmol/L (ref 96–106)
Creatinine, Ser: 0.6 mg/dL (ref 0.57–1.00)
Globulin, Total: 3.2 g/dL (ref 1.5–4.5)
Glucose: 121 mg/dL — ABNORMAL HIGH (ref 70–99)
Potassium: 3.4 mmol/L — ABNORMAL LOW (ref 3.5–5.2)
Sodium: 137 mmol/L (ref 134–144)
Total Protein: 7.4 g/dL (ref 6.0–8.5)
eGFR: 113 mL/min/{1.73_m2} (ref >59)

## 2021-09-19 LAB — CBC WITH DIFFERENTIAL/PLATELET
Basophils Absolute: 0 10*3/uL (ref 0.0–0.2)
Basos: 0 %
EOS (ABSOLUTE): 0.1 10*3/uL (ref 0.0–0.4)
Eos: 1 %
Hematocrit: 32.5 % — ABNORMAL LOW (ref 34.0–46.6)
Hemoglobin: 9 g/dL — ABNORMAL LOW (ref 11.1–15.9)
Immature Grans (Abs): 0 10*3/uL (ref 0.0–0.1)
Immature Granulocytes: 0 %
Lymphocytes Absolute: 1.3 10*3/uL (ref 0.7–3.1)
Lymphs: 15 %
MCH: 18.1 pg — ABNORMAL LOW (ref 26.6–33.0)
MCHC: 27.7 g/dL — ABNORMAL LOW (ref 31.5–35.7)
MCV: 66 fL — ABNORMAL LOW (ref 79–97)
Monocytes Absolute: 0.5 10*3/uL (ref 0.1–0.9)
Monocytes: 6 %
Neutrophils Absolute: 6.9 10*3/uL (ref 1.4–7.0)
Neutrophils: 78 %
Platelets: 400 10*3/uL (ref 150–450)
RBC: 4.96 x10E6/uL (ref 3.77–5.28)
RDW: 17.4 % — ABNORMAL HIGH (ref 11.7–15.4)
WBC: 8.8 10*3/uL (ref 3.4–10.8)

## 2021-09-19 LAB — TSH: TSH: 1.44 u[IU]/mL (ref 0.450–4.500)

## 2021-09-19 LAB — HIV ANTIBODY (ROUTINE TESTING W REFLEX): HIV Screen 4th Generation wRfx: NONREACTIVE

## 2021-09-19 LAB — VITAMIN D 25 HYDROXY (VIT D DEFICIENCY, FRACTURES): Vit D, 25-Hydroxy: 19.9 ng/mL — ABNORMAL LOW (ref 30.0–100.0)

## 2021-09-19 LAB — HEPATITIS C ANTIBODY: Hep C Virus Ab: <0.1 s/co ratio (ref 0.0–0.9)

## 2021-09-22 ENCOUNTER — Other Ambulatory Visit: Payer: Self-pay | Admitting: Family

## 2021-09-22 ENCOUNTER — Encounter: Payer: Self-pay | Admitting: Cardiology

## 2021-09-22 DIAGNOSIS — R072 Precordial pain: Secondary | ICD-10-CM | POA: Insufficient documentation

## 2021-09-22 MED ORDER — VITAMIN D (ERGOCALCIFEROL) 1.25 MG (50000 UNIT) PO CAPS: 50000.0000 [IU] | ORAL_CAPSULE | ORAL | 3 refills | Status: DC

## 2021-09-22 NOTE — Progress Notes (Signed)
°  ° °  The patient arrived for an appointment.  We did have an interpreter.  Her husband spoke to the interpreter and said he did not need 1 and he would interpret.  During the room I tried to ask patient questions and they were all being answered by her husband.  When I told her husband that we would prefer to use a medical interpreter he abruptly ended the appointment said that he would not use an interpreter in the left.  During this time when I was asking questions the husband cannot interpret or translate for his wife.  She was not given the opportunity to answer any questions.  We are attempting to call the interpreter to understand what has been in the interaction in the lobby.  Again, we have arranged for a medical interpreter as per our rules and regulations and her husband became angry with this.

## 2021-09-23 ENCOUNTER — Encounter: Payer: BC Managed Care – PPO | Admitting: Cardiology

## 2021-09-23 ENCOUNTER — Encounter: Payer: Self-pay | Admitting: Cardiology

## 2021-09-23 ENCOUNTER — Other Ambulatory Visit: Payer: Self-pay

## 2021-09-23 VITALS — BP 122/80 | HR 76 | Ht 64.0 in | Wt 163.0 lb

## 2021-09-23 DIAGNOSIS — R072 Precordial pain: Secondary | ICD-10-CM

## 2021-09-23 DIAGNOSIS — E781 Pure hyperglyceridemia: Secondary | ICD-10-CM

## 2021-09-23 LAB — IGP,CTNGTV,APT HPV
Chlamydia, Nuc. Acid Amp: NEGATIVE
Gonococcus, Nuc. Acid Amp: NEGATIVE
Trich vag by NAA: NEGATIVE

## 2021-09-24 LAB — HGB A1C W/O EAG: Hgb A1c MFr Bld: 5.8 % — ABNORMAL HIGH (ref 4.8–5.6)

## 2021-09-24 LAB — VITAMIN B12: Vitamin B-12: 1135 pg/mL (ref 232–1245)

## 2021-09-24 LAB — IRON: Iron: 14 ug/dL — ABNORMAL LOW (ref 27–159)

## 2021-09-24 LAB — SPECIMEN STATUS REPORT

## 2021-12-18 ENCOUNTER — Ambulatory Visit: Payer: BC Managed Care – PPO | Admitting: Family

## 2021-12-18 ENCOUNTER — Encounter: Payer: Self-pay | Admitting: Family

## 2021-12-18 VITALS — BP 135/83 | HR 73 | Temp 97.0°F | Ht 64.0 in | Wt 165.2 lb

## 2021-12-18 DIAGNOSIS — Z0000 Encounter for general adult medical examination without abnormal findings: Secondary | ICD-10-CM

## 2021-12-18 DIAGNOSIS — R7303 Prediabetes: Secondary | ICD-10-CM | POA: Diagnosis not present

## 2021-12-18 DIAGNOSIS — E663 Overweight: Secondary | ICD-10-CM | POA: Diagnosis not present

## 2021-12-18 DIAGNOSIS — Z0001 Encounter for general adult medical examination with abnormal findings: Secondary | ICD-10-CM | POA: Diagnosis not present

## 2021-12-18 DIAGNOSIS — Z1211 Encounter for screening for malignant neoplasm of colon: Secondary | ICD-10-CM

## 2021-12-18 DIAGNOSIS — E559 Vitamin D deficiency, unspecified: Secondary | ICD-10-CM

## 2021-12-18 NOTE — Progress Notes (Signed)
? ?Subjective:  ? ? Patient ID: Toni Le, female    DOB: 1976-02-15, 46 y.o.   MRN: [id number] ? ?Chief Complaint  ?Patient presents with  ? Medical Management of Chronic Issues  ?  No concerns   ? ?PT presents to the office today for CPE. Her last lab work showed she was prediabetic. Reports her diet is good.  ?Diabetes ?She presents for her follow-up diabetic visit. She has type 2 diabetes mellitus. Pertinent negatives for diabetes include no blurred vision and no foot paresthesias. Risk factors for coronary artery disease include diabetes mellitus, dyslipidemia and sedentary lifestyle. (Does not check BS at home)  ? ? ? ?Review of Systems  ?Eyes:  Negative for blurred vision.  ?All other systems reviewed and are negative. ? ?Family History  ?Problem Relation Age of Onset  ? Asthma Mother   ? Healthy Father   ? ?Social History  ? ?Socioeconomic History  ? Marital status: Married  ?  Spouse name: Not on file  ? Number of children: Not on file  ? Years of education: Not on file  ? Highest education level: Not on file  ?Occupational History  ? Not on file  ?Tobacco Use  ? Smoking status: Never  ? Smokeless tobacco: Never  ?Substance and Sexual Activity  ? Alcohol use: No  ?  Alcohol/week: 0.0 standard drinks  ? Drug use: No  ? Sexual activity: Yes  ?Other Topics Concern  ? Not on file  ?Social History Narrative  ? Not on file  ? ?Social Determinants of Health  ? ?Financial Resource Strain: Not on file  ?Food Insecurity: Not on file  ?Transportation Needs: Not on file  ?Physical Activity: Not on file  ?Stress: Not on file  ?Social Connections: Not on file  ? ? ?   ?Objective:  ? Physical Exam ?Vitals reviewed.  ?Constitutional:   ?   General: She is not in acute distress. ?   Appearance: She is well-developed.  ?HENT:  ?   Head: Normocephalic and atraumatic.  ?   Right Ear: Tympanic membrane normal.  ?   Left Ear: Tympanic membrane normal.  ?Eyes:  ?   Pupils: Pupils are equal, round, and reactive to light.   ?Neck:  ?   Thyroid: No thyromegaly.  ?Cardiovascular:  ?   Rate and Rhythm: Normal rate and regular rhythm.  ?   Heart sounds: Normal heart sounds. No murmur heard. ?Pulmonary:  ?   Effort: Pulmonary effort is normal. No respiratory distress.  ?   Breath sounds: Normal breath sounds. No wheezing.  ?Abdominal:  ?   General: Bowel sounds are normal. There is no distension.  ?   Palpations: Abdomen is soft.  ?   Tenderness: There is no abdominal tenderness.  ?Musculoskeletal:     ?   General: No tenderness. Normal range of motion.  ?   Cervical back: Normal range of motion and neck supple.  ?Skin: ?   General: Skin is warm and dry.  ?Neurological:  ?   Mental Status: She is alert and oriented to person, place, and time.  ?   Cranial Nerves: No cranial nerve deficit.  ?   Deep Tendon Reflexes: Reflexes are normal and symmetric.  ?Psychiatric:     ?   Behavior: Behavior normal.     ?   Thought Content: Thought content normal.     ?   Judgment: Judgment normal.  ? ? ? ? ?BP 135/83   Pulse 73  Temp (!) 97 ?F (36.1 ?C) (Temporal)   Ht _0  (1.626 m)   Wt 165 lb 3.2 oz (74.9 kg)   SpO2 100%   BMI 28.36 kg/m?  ? ?   ?Assessment & Plan:  ?Toni Le comes in today with chief complaint of Medical Management of Chronic Issues (No concerns ) ? ? ?Diagnosis and orders addressed: ? ?1. Prediabetes ?- Bayer DCA Hb A1c Waived ?- CMP14+EGFR ?- CBC with Differential/Platelet ? ?2. Overweight (BMI 25.0-29.9) ?- CMP14+EGFR ?- CBC with Differential/Platelet ? ?3. Vitamin D deficiency ?- CMP14+EGFR ?- CBC with Differential/Platelet ?- VITAMIN D 25 Hydroxy (Vit-D Deficiency, Fractures) ? ?4. Colon cancer screening ?- Ambulatory referral to Gastroenterology ?- CMP14+EGFR ?- CBC with Differential/Platelet ? ?5. Annual physical exam ? ?- Bayer DCA Hb A1c Waived ?- CMP14+EGFR ?- CBC with Differential/Platelet ?- TSH ?- VITAMIN D 25 Hydroxy (Vit-D Deficiency, Fractures) ? ? ?Labs pending ?Health Maintenance reviewed ?Diet and  exercise encouraged ? ?Follow up plan: ?1 year  ? ?Evelina Dun, FNP ? ? ? ?

## 2021-12-18 NOTE — Patient Instructions (Addendum)

## 2021-12-21 ENCOUNTER — Other Ambulatory Visit: Payer: Self-pay | Admitting: Family

## 2021-12-21 DIAGNOSIS — Z1231 Encounter for screening mammogram for malignant neoplasm of breast: Secondary | ICD-10-CM

## 2021-12-23 ENCOUNTER — Inpatient Hospital Stay: Admission: RE | Admit: 2021-12-23 | Payer: BC Managed Care – PPO | Source: Ambulatory Visit

## 2021-12-29 ENCOUNTER — Other Ambulatory Visit: Payer: Self-pay | Admitting: Family

## 2021-12-29 DIAGNOSIS — Z09 Encounter for follow-up examination after completed treatment for conditions other than malignant neoplasm: Secondary | ICD-10-CM

## 2021-12-29 DIAGNOSIS — M7989 Other specified soft tissue disorders: Secondary | ICD-10-CM

## 2022-01-29 ENCOUNTER — Ambulatory Visit
Admission: RE | Admit: 2022-01-29 | Discharge: 2022-01-29 | Disposition: A | Discharge status: Home / Self Care | Payer: BC Managed Care – PPO | Source: Ambulatory Visit | Attending: Family | Admitting: Family

## 2022-01-29 ENCOUNTER — Other Ambulatory Visit: Payer: Self-pay | Admitting: Family

## 2022-01-29 DIAGNOSIS — Z09 Encounter for follow-up examination after completed treatment for conditions other than malignant neoplasm: Secondary | ICD-10-CM

## 2022-01-29 DIAGNOSIS — M7989 Other specified soft tissue disorders: Secondary | ICD-10-CM

## 2022-01-29 DIAGNOSIS — N6311 Unspecified lump in the right breast, upper outer quadrant: Secondary | ICD-10-CM | POA: Diagnosis not present

## 2022-01-29 DIAGNOSIS — N6310 Unspecified lump in the right breast, unspecified quadrant: Secondary | ICD-10-CM

## 2022-01-29 DIAGNOSIS — R928 Other abnormal and inconclusive findings on diagnostic imaging of breast: Secondary | ICD-10-CM | POA: Diagnosis not present

## 2022-02-01 ENCOUNTER — Ambulatory Visit
Admission: RE | Admit: 2022-02-01 | Discharge: 2022-02-01 | Disposition: A | Discharge status: Home / Self Care | Payer: BC Managed Care – PPO | Source: Ambulatory Visit | Attending: Family | Admitting: Family

## 2022-02-01 DIAGNOSIS — N6311 Unspecified lump in the right breast, upper outer quadrant: Secondary | ICD-10-CM | POA: Diagnosis not present

## 2022-02-01 DIAGNOSIS — N6310 Unspecified lump in the right breast, unspecified quadrant: Secondary | ICD-10-CM

## 2022-02-01 DIAGNOSIS — N62 Hypertrophy of breast: Secondary | ICD-10-CM | POA: Diagnosis not present

## 2022-02-12 ENCOUNTER — Ambulatory Visit: Payer: Self-pay | Admitting: Surgery

## 2022-02-12 DIAGNOSIS — N6311 Unspecified lump in the right breast, upper outer quadrant: Secondary | ICD-10-CM

## 2022-02-13 ENCOUNTER — Ambulatory Visit: Payer: Self-pay | Admitting: Surgery

## 2022-02-13 NOTE — H&P (Signed)
Subjective    Chief Complaint: right breast lesion   Telephone interpreter was offered, but the husband decided to translate.   History of Present Illness: Toni Le is a 46 y.o. female who is seen today as an office consultation at the request of Dr. Lenna Gilford for evaluation of right breast lesion .     This is a 46 year old female in good health who presents with a 3 year history of a mass in the RUOQ.  She recently had a follow-up mammogram and ultrasound.  This revealed  a 1.8 x 1 x 1.6 cm circumscribed oval hypoechoic mass at the 10 o'clock position of the RIGHT breast 7 cm from the nipple, previously measuring 0.7 x 1.2 x 1.1 cm. Volume of this mass has tripled since 11/24/2018.   The mass was biopsied and revealed a diagnosis of fibroepithelial lesion.  Because of the enlarging size, she is referred to discuss surgery.   No family history of breast cancer.  Previous benign left breast biopsy.   Review of Systems: A complete review of systems was obtained from the patient.  I have reviewed this information and discussed as appropriate with the patient.  See HPI as well for other ROS.   Review of Systems  Constitutional: Negative.   HENT: Negative.   Eyes: Negative.   Respiratory: Negative.   Cardiovascular: Negative.   Gastrointestinal: Negative.   Genitourinary: Negative.   Musculoskeletal: Negative.   Skin: Negative.   Neurological: Negative.   Endo/Heme/Allergies: Negative.   Psychiatric/Behavioral: Negative.         Medical History: Past Medical History  History reviewed. No pertinent past medical history.        Patient Active Problem List  Diagnosis   Hypertriglyceridemia   Overweight (BMI 25.0-29.9)   Precordial chest pain   Mass of upper outer quadrant of right breast      C-section   Allergies      Allergies  Allergen Reactions   Penicillins Rash        No current outpatient medications on file prior to visit.    No current  facility-administered medications on file prior to visit.      Family History  History reviewed. No pertinent family history.      Social History       Tobacco Use  Smoking Status Never  Smokeless Tobacco Never      Social History  Social History        Socioeconomic History   Marital status: Married  Tobacco Use   Smoking status: Never   Smokeless tobacco: Never  Substance and Sexual Activity   Alcohol use: Never   Drug use: Never        Objective:         Vitals:    02/12/22 0942  BP: 132/80  Pulse: 107  Temp: 36.6 C (97.8 F)  SpO2: 98%  Weight: 75.4 kg (166 lb 3.2 oz)  Height: 162.6 cm ('5\' 4"'$ )    Body mass index is 28.53 kg/m.   Physical Exam    Constitutional:  WDWN in NAD, conversant, no obvious deformities; lying in bed comfortably Eyes:  Pupils equal, round; sclera anicteric; moist conjunctiva; no lid lag HENT:  Oral mucosa moist; good dentition  Neck:  No masses palpated, trachea midline; no thyromegaly Lungs:  CTA bilaterally; normal respiratory effort Breasts:  symmetric, no nipple changes; no palpable masses or lymphadenopathy on either side CV:  Regular rate and rhythm; no murmurs; extremities  well-perfused with no edema Abd:  +bowel sounds, soft, non-tender, no palpable organomegaly; no palpable hernias Musc:  Normal gait; no apparent clubbing or cyanosis in extremities Lymphatic:  No palpable cervical or axillary lymphadenopathy Skin:  Warm, dry; no sign of jaundice Psychiatric - alert and oriented x 4; calm mood and affect     Labs, Imaging and Diagnostic Testing:   FINAL DIAGNOSIS Diagnosis Breast, right, needle core biopsy, 10 o'clock, 7 cmfn, ribbon shaped clip - FIBROEPITHELIAL LESION WITH USUAL DUCTAL HYPERPLASIA. SEE NOTE - NEGATIVE FOR CARCINOMA Diagnosis Note Differential diagnosis for the fibroepithelial lesion includes benign Phyllodes tumor and fibroadenoma. Dr. Saralyn Pilar concurs with the diagnosis. [facility] was notified on 02/02/2022. Jaquita Folds MD Pathologist, Electronic Signature (Case signed 02/02/2022)   CLINICAL DATA:  46 year old female for delayed follow-up of UPPER-OUTER RIGHT breast mass and for annual bilateral mammograms. History of benign LEFT breast biopsy.   EXAM: DIGITAL DIAGNOSTIC BILATERAL MAMMOGRAM WITH TOMOSYNTHESIS AND CAD; ULTRASOUND RIGHT BREAST LIMITED   TECHNIQUE: Bilateral digital diagnostic mammography and breast tomosynthesis was performed. The images were evaluated with computer-aided detection.; Targeted ultrasound examination of the right breast was performed   COMPARISON:  Previous exam(s).   ACR Breast Density Category b: There are scattered areas of fibroglandular density.   FINDINGS: 2D/3D full field views of both breasts demonstrate an enlarging circumscribed oval mass within the UPPER-OUTER RIGHT breast.   A stable circumscribed oval mass containing a RIBBON biopsy clip within the RETROAREOLAR LEFT breast is again identified.   No new mammographic abnormalities are otherwise noted within either breast.   Targeted ultrasound is performed, showing a 1.8 x 1 x 1.6 cm circumscribed oval hypoechoic mass at the 10 o'clock position of the RIGHT breast 7 cm from the nipple, previously measuring 0.7 x 1.2 x 1.1 cm. Volume of this mass has tripled since 11/24/2018.   No abnormal RIGHT axillary lymph nodes are noted.   IMPRESSION: 1. Increase in size of UPPER-OUTER RIGHT breast mass with volume tripled since 2020. Tissue sampling is recommended. 2. No other new or suspicious abnormalities noted within either breast.   RECOMMENDATION: Ultrasound-guided RIGHT breast biopsy which will be scheduled.   I have discussed the findings and recommendations with the patient. If applicable, a reminder letter will be sent to the patient regarding the next appointment.   BI-RADS CATEGORY  4: Suspicious.     Electronically  Signed   By: Margarette Canada M.D.   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  Will plan right breast radioactive seed localized lumpectomy.  The surgical procedure has been discussed with the patient.  Potential risks, benefits, alternative treatments, and expected outcomes have been explained.  All of the patient's questions at this time have been answered.  The likelihood of reaching the patient's treatment goal is good.  The patient understand the proposed surgical procedure and wishes to proceed.   Rebeca Valdivia Jearld Adjutant, MD  02/13/2022 11:48 AM

## 2022-09-19 ENCOUNTER — Other Ambulatory Visit: Payer: Self-pay | Admitting: Family

## 2022-12-13 ENCOUNTER — Encounter: Payer: Self-pay | Admitting: Family Medicine

## 2022-12-13 ENCOUNTER — Ambulatory Visit (INDEPENDENT_AMBULATORY_CARE_PROVIDER_SITE_OTHER): Payer: BC Managed Care – PPO

## 2022-12-13 ENCOUNTER — Ambulatory Visit: Payer: BC Managed Care – PPO | Admitting: Family Medicine

## 2022-12-13 VITALS — BP 129/72 | HR 97 | Temp 103.7°F | Ht 64.0 in | Wt 164.8 lb

## 2022-12-13 DIAGNOSIS — J189 Pneumonia, unspecified organism: Secondary | ICD-10-CM

## 2022-12-13 DIAGNOSIS — J069 Acute upper respiratory infection, unspecified: Secondary | ICD-10-CM

## 2022-12-13 DIAGNOSIS — R059 Cough, unspecified: Secondary | ICD-10-CM | POA: Diagnosis not present

## 2022-12-13 LAB — VERITOR FLU A/B WAIVED
Influenza A: NEGATIVE
Influenza B: NEGATIVE

## 2022-12-13 MED ORDER — CHERATUSSIN AC 100-10 MG/5ML PO SOLN: 5.0000 mL | ORAL | 0 refills | Status: DC | PRN

## 2022-12-13 MED ORDER — MOXIFLOXACIN HCL 400 MG PO TABS: 400.0000 mg | ORAL_TABLET | Freq: Every day | ORAL | 0 refills | Status: DC

## 2022-12-13 NOTE — Progress Notes (Signed)
Subjective:  Patient ID: Toni Le, female    DOB: 08/26/1976  Age: 47 y.o. MRN: [id number]  CC: Cough, Nasal Congestion, Fever, Emesis, Chills, Headache, and Shortness of Breath   HPI Toni Le presents for fever, cough and dyspnea for 4 days. Vomiting at first has resolved. Now taking in bland foods. No diarrhea or abdominal pain. She does have rhinorrhea, sore throat and no earache.      12/13/2022    3:06 PM 12/18/2021    3:09 PM 05/08/2021    2:58 PM  Depression screen PHQ 2/9  Decreased Interest 2 0 0  Down, Depressed, Hopeless 0 0 0  PHQ - 2 Score 2 0 0  Altered sleeping 1  0  Tired, decreased energy 0  0  Change in appetite 0  0  Feeling bad or failure about yourself  0  0  Trouble concentrating 0  0  Moving slowly or fidgety/restless 0  0  Suicidal thoughts 0  0  PHQ-9 Score 3  0  Difficult doing work/chores Not difficult at all      History Toni Le has a past medical history of Hypertension and Hypertriglyceridemia.   She has a past surgical history that includes Cesarean section.   Her family history includes Asthma in her mother; Healthy in her father.She reports that she has never smoked. She has never used smokeless tobacco. She reports that she does not drink alcohol and does not use drugs.    ROS Review of Systems  Constitutional:  Negative for activity change, appetite change, chills and fever.  HENT:  Positive for congestion and rhinorrhea. Negative for ear discharge, ear pain, hearing loss, nosebleeds, sneezing and trouble swallowing.   Respiratory:  Positive for cough and shortness of breath. Negative for chest tightness and wheezing.   Cardiovascular:  Negative for chest pain and palpitations.  Skin:  Negative for rash.    Objective:  BP 129/72   Pulse 97   Temp (!) 103.7 F (39.8 C)   Ht 5\' 4"  (1.626 m)   Wt 164 lb 12.8 oz (74.8 kg)   SpO2 98%   BMI 28.29 kg/m   BP Readings from Last 3 Encounters:  12/13/22 129/72  12/18/21  135/83  09/23/21 122/80    Wt Readings from Last 3 Encounters:  12/13/22 164 lb 12.8 oz (74.8 kg)  12/18/21 165 lb 3.2 oz (74.9 kg)  09/23/21 163 lb (73.9 kg)     Physical Exam Constitutional:      Appearance: She is well-developed.  HENT:     Head: Normocephalic and atraumatic.     Right Ear: Tympanic membrane and external ear normal. No decreased hearing noted.     Left Ear: Tympanic membrane and external ear normal. No decreased hearing noted.     Nose: Mucosal edema present.     Right Sinus: No frontal sinus tenderness.     Left Sinus: No frontal sinus tenderness.     Mouth/Throat:     Pharynx: No oropharyngeal exudate or posterior oropharyngeal erythema.  Eyes:     Extraocular Movements: Extraocular movements intact.     Pupils: Pupils are equal, round, and reactive to light.  Neck:     Meningeal: Brudzinski's sign absent.  Pulmonary:     Effort: No respiratory distress.     Breath sounds: Wheezing (faint) present.  Chest:     Chest wall: No deformity.  Lymphadenopathy:     Head:     Right side of head:  No preauricular adenopathy.     Left side of head: No preauricular adenopathy.     Cervical: No cervical adenopathy.     Right cervical: No superficial cervical adenopathy.    Left cervical: No superficial cervical adenopathy.    CXR - RLL infiltrate likely. Preliminary reading done by Randell Loop  Confirmed y radiology as RML infiltrate  Assessment & Plan:   Toni Le was seen today for cough, nasal congestion, fever, emesis, chills, headache and shortness of breath.  Diagnoses and all orders for this visit:  Upper respiratory infection with cough and congestion -     Veritor Flu A/B Waived -     COVID-19, Flu A+B and RSV -     DG Chest 2 View; Future  Pneumonia of right lower lobe due to infectious organism  Other orders -     moxifloxacin (AVELOX) 400 MG tablet; Take 1 tablet (400 mg total) by mouth daily. -     guaiFENesin-codeine (CHERATUSSIN AC)  100-10 MG/5ML syrup; Take 5 mLs by mouth every 4 (four) hours as needed for cough.       I have discontinued Verdis Frederickson B. Cedrone's ibuprofen and Vitamin D (Ergocalciferol). I am also having her start on moxifloxacin and Cheratussin AC.  Allergies as of 12/13/2022       Reactions   Penicillins         Medication List        Accurate as of December 13, 2022  4:01 PM. If you have any questions, ask your nurse or doctor.          STOP taking these medications    ibuprofen 200 MG tablet Commonly known as: ADVIL Stopped by: Claretta Fraise, MD   Vitamin D (Ergocalciferol) 1.25 MG (50000 UNIT) Caps capsule Commonly known as: DRISDOL Stopped by: Claretta Fraise, MD       TAKE these medications    Cheratussin AC 100-10 MG/5ML syrup Generic drug: guaiFENesin-codeine Take 5 mLs by mouth every 4 (four) hours as needed for cough. Started by: Claretta Fraise, MD   moxifloxacin 400 MG tablet Commonly known as: AVELOX Take 1 tablet (400 mg total) by mouth daily. Started by: Claretta Fraise, MD         Follow-up: Return if symptoms worsen or fail to improve.  Claretta Fraise, M.D.

## 2022-12-14 LAB — COVID-19, FLU A+B AND RSV
Influenza A, NAA: NOT DETECTED
Influenza B, NAA: NOT DETECTED
RSV, NAA: NOT DETECTED
SARS-CoV-2, NAA: NOT DETECTED

## 2023-09-15 ENCOUNTER — Ambulatory Visit: Payer: Self-pay | Admitting: Family

## 2023-09-15 NOTE — Progress Notes (Deleted)
OFFICE VISIT  09/15/2023  CC: No chief complaint on file.   Patient is a 47 y.o. female who presents for vaginal itching/burning with urination.  HPI: ***  Past Medical History:  Diagnosis Date   Hypertension    Hypertriglyceridemia     Past Surgical History:  Procedure Laterality Date   CESAREAN SECTION      Outpatient Medications Prior to Visit  Medication Sig Dispense Refill   guaiFENesin-codeine (CHERATUSSIN AC) 100-10 MG/5ML syrup Take 5 mLs by mouth every 4 (four) hours as needed for cough. 180 mL 0   moxifloxacin (AVELOX) 400 MG tablet Take 1 tablet (400 mg total) by mouth daily. 10 tablet 0   No facility-administered medications prior to visit.    Allergies  Allergen Reactions   Penicillins     Review of Systems  As per HPI  PE:    12/13/2022    2:56 PM 12/18/2021    3:10 PM 09/23/2021   11:14 AM  Vitals with BMI  Height 5\' 4"  5\' 4"  5\' 4"   Weight 164 lbs 13 oz 165 lbs 3 oz 163 lbs  BMI 28.27 28.34 27.97  Systolic 129 135 161  Diastolic 72 83 80  Pulse 97 73 76     Physical Exam  ***  LABS:  {Labs (Optional):23779}  IMPRESSION AND PLAN:  No problem-specific Assessment & Plan notes found for this encounter.   An After Visit Summary was printed and given to the patient.  FOLLOW UP: No follow-ups on file.  Signed:  Santiago Bumpers, MD           09/15/2023

## 2023-09-15 NOTE — Telephone Encounter (Signed)
Copied from CRM [id number]. Topic: Clinical - Red Word Triage >> Sep 15, 2023  3:58 PM Alvino Blood C wrote: Red Word that prompted transfer to Nurse Triage: Pain & itching during urination     Chief Complaint: vaginal itching Symptoms: burning with urination Frequency: constant Pertinent Negatives: Patient denies vaginal discharge Disposition: [] ED /[] Urgent Care (no appt availability in office) / [x] Appointment(In office/virtual)/ []  [facility]/ [] Home Care/ [] Refused Recommended Disposition /[] [facility]/ []  Follow-up with PCP Additional Notes: Pts husband calling on behalf of patient. Pt has been experiencing vaginal intching and burning with urination x 3 days. No vaginal discharge or pain, sts that the itching is internal. No available appts at [facility], pt scheduled at [facility] 12/27 at 0820      Reason for Disposition  MODERATE-SEVERE itching (i.e., interferes with school, work, or sleep)  Answer Assessment - Initial Assessment Questions 1. SYMPTOM: "What's the main symptom you're concerned about?" (e.g., pain, itching, dryness)     Vaginal itching and buring with urination  2. LOCATION: "Where is the  itcing located?" (e.g., inside/outside, left/right)     Vaginal itching inside  3. ONSET: "When did the  itching start  start?"     3 days ago  4. PAIN: "Is there any pain?" If Yes, ask: "How bad is it?" (Scale: 1-10; mild, moderate, severe)   -  MILD (1-3): Doesn't interfere with normal activities.    -  MODERATE (4-7): Interferes with normal activities (e.g., work or school) or awakens from sleep.     -  SEVERE (8-10): Excruciating pain, unable to do any normal activities.     No pain, just burning with urination  5. ITCHING: "Is there any itching?" If Yes, ask: "How bad is it?" (Scale: 1-10; mild, moderate, severe)     Mild itching  6. CAUSE: "What do you think is causing the discharge?" "Have you had the same problem before? What happened then?"      Unsure of what's causing the itching  7. OTHER SYMPTOMS: "Do you have any other symptoms?" (e.g., fever, itching, vaginal bleeding, pain with urination, injury to genital area, vaginal foreign body)     Burning with urination  8. PREGNANCY: "Is there any chance you are pregnant?" "When was your last menstrual period?"     No  Protocols used: Vaginal Symptoms-A-AH

## 2023-09-16 ENCOUNTER — Ambulatory Visit: Payer: BC Managed Care – PPO | Admitting: Family Medicine

## 2024-04-10 DIAGNOSIS — J40 Bronchitis, not specified as acute or chronic: Secondary | ICD-10-CM | POA: Diagnosis not present

## 2024-04-10 DIAGNOSIS — J069 Acute upper respiratory infection, unspecified: Secondary | ICD-10-CM | POA: Diagnosis not present

## 2024-04-10 DIAGNOSIS — Z20822 Contact with and (suspected) exposure to covid-19: Secondary | ICD-10-CM | POA: Diagnosis not present

## 2024-10-04 ENCOUNTER — Ambulatory Visit: Admitting: Nurse Practitioner

## 2024-10-04 ENCOUNTER — Encounter: Payer: Self-pay | Admitting: Nurse Practitioner

## 2024-10-04 VITALS — BP 138/88 | HR 72 | Temp 97.0°F | Ht 64.0 in | Wt 162.0 lb

## 2024-10-04 DIAGNOSIS — Z09 Encounter for follow-up examination after completed treatment for conditions other than malignant neoplasm: Secondary | ICD-10-CM

## 2024-10-04 DIAGNOSIS — K1120 Sialoadenitis, unspecified: Secondary | ICD-10-CM

## 2024-10-04 DIAGNOSIS — D509 Iron deficiency anemia, unspecified: Secondary | ICD-10-CM

## 2024-10-04 LAB — ANEMIA PROFILE B

## 2024-10-04 NOTE — Progress Notes (Signed)
 "  Subjective:    Patient ID: Toni Le, female    DOB: 1976/05/06, 49 y.o.   MRN: [id number]  Chief Complaint: Hospitalization Follow-up (Neck is still slightly swollen/Now has a cough/Also wants to discuss anemia)   Interpreter # [phone]  HPI  Patient went to the ED on 09/29/24 with right sided neck swelling. Tested negative for strep and influenza. They assumed sialoadneitis. She was given levaquin, cleocin  and decadon in ED. Was discharged home on cleocin  and levaquin. Since being on antibiotics swelling has gone down.  Incidental finding at hospital hgb was 7.1- she was given iro supplement sto take. Patient Active Problem List   Diagnosis Date Noted   Precordial chest pain 09/22/2021   Hypertriglyceridemia 10/04/2017   Overweight (BMI 25.0-29.9) 10/03/2017   Vitamin D  deficiency 10/08/2015       Review of Systems  Constitutional:  Negative for diaphoresis.  HENT:  Negative for trouble swallowing.   Eyes:  Negative for pain.  Respiratory:  Negative for shortness of breath.   Cardiovascular:  Negative for chest pain, palpitations and leg swelling.  Gastrointestinal:  Negative for abdominal pain, nausea and vomiting.  Endocrine: Negative for polydipsia.  Skin:  Negative for rash.  Neurological:  Negative for dizziness, weakness and headaches.  Hematological:  Does not bruise/bleed easily.  All other systems reviewed and are negative.      Objective:   Physical Exam Constitutional:      Appearance: Normal appearance. She is obese.  HENT:     Right Ear: Tympanic membrane normal.     Left Ear: Tympanic membrane normal.     Nose: No congestion or rhinorrhea.  Cardiovascular:     Rate and Rhythm: Normal rate and regular rhythm.     Heart sounds: Normal heart sounds.  Pulmonary:     Effort: Pulmonary effort is normal.     Breath sounds: Normal breath sounds.  Musculoskeletal:     Cervical back: Normal range of motion and neck supple. No rigidity or tenderness.   Lymphadenopathy:     Cervical: No cervical adenopathy.  Skin:    General: Skin is warm.  Neurological:     General: No focal deficit present.     Mental Status: She is alert and oriented to person, place, and time.  Psychiatric:        Mood and Affect: Mood normal.        Behavior: Behavior normal.    BP 138/88   Pulse 72   Temp (!) 97 F (36.1 C) (Temporal)   Ht 5' 4 (1.626 m)   Wt 162 lb (73.5 kg)   SpO2 100%   BMI 27.81 kg/m         Assessment & Plan:   [name] Code in today with chief complaint of Hospitalization Follow-up (Neck is still slightly swollen/Now has a cough/Also wants to discuss anemia)   1. Salivary gland inflammation (Primary) Improved Finish antibiotics  2. Hospital discharge follow-up Hospital records reviewed  3. Iron deficiency anemia, unspecified iron deficiency anemia type Repeat labs to day- will call tomorrow with results Continue iron supplements for now. Hemocult cards  The above assessment and management plan was discussed with the patient. The patient verbalized understanding of and has agreed to the management plan. Patient is aware to call the clinic if symptoms persist or worsen. Patient is aware when to return to the clinic for a follow-up visit. Patient educated on when it is appropriate to go to the emergency department.  [name], FNP   "

## 2024-10-04 NOTE — Patient Instructions (Addendum)
 Anemia Anemia  La anemia es una afeccin en la que no hay una cantidad suficiente de glbulos rojos o Scientist, research (medical). La hemoglobina es la sustancia de los glbulos rojos que transporta el oxgeno. Cuando no hay suficientes glbulos rojos o hemoglobina (se est anmico), el cuerpo no puede recibir el oxgeno suficiente, y es posible que los rganos no funcionen correctamente. Como [place], es posible que se sienta muy cansado o sufra otros problemas. Cules son las causas? Las causas ms frecuentes de anemia son: Elvina Mattes. La anemia puede ser causada por un sangrado excesivo dentro o fuera del cuerpo, incluido sangrado de los intestinos o a causa de perodos menstruales abundantes en las mujeres. Nutricin deficiente. Enfermedad heptica, tiroidea o renal prolongada (crnica). Trastornos de la mdula sea, problemas en el bazo y trastornos de Herbalist. Cncer y tratamientos para Science writer. Virus de inmunodeficiencia humana (VIH) y sndrome de inmunodeficiencia adquirida (SIDA). Infecciones, medicamentos y enfermedades autoinmunes que [employer] glbulos rojos. Cules son los signos o sntomas? Los sntomas de esta afeccin incluyen: Debilidad leve. Mareos. Dolor de [place] para concentrarse y dormir. Latidos cardacos irregulares o ms rpidos que lo normal (palpitaciones). Falta de aire, especialmente con el ejercicio. Piel, labios y uas plidos, o manos y pies fros. Malestar estomacal (indigestin) y nuseas. Los sntomas pueden ocurrir repentinamente o Administrator, Civil Service. Si la anemia es leve, es posible que no tenga sntomas. Cmo se diagnostica? Esta afeccin se diagnostica en funcin de anlisis de sangre, los antecedentes mdicos y un examen fsico. En algunos casos, se puede necesitar una prueba en la que se extraen clulas del tejido blando que est dentro de un hueso y se las observa con un microscopio (biopsia de mdula sea). Adems,  el mdico puede controlar si hay sangre en sus heces (materia fecal) y realizar ms anlisis para Product manager causa del sangrado. Otras pruebas que pueden realizarle son las siguientes: Pruebas de diagnstico por imgenes, como una resonancia magntica (RM) o una exploracin por tomografa computarizada (TC). Un procedimiento para examinar el interior del esfago y Product manager (endoscopa). El esfago es el rgano del cuerpo que transporta los alimentos desde la boca al [place]. Un procedimiento para examinar el interior del colon y el recto (colonoscopa). Cmo se trata? El tratamiento de esta afeccin depende de la causa. Si contina perdiendo [employer], es posible que necesite recibir tratamiento en un hospital. El tratamiento puede incluir: Tomar suplementos de hierro, vitamina [id number] o cido flico. Tomar un medicamento para las hormonas (eritropoyetina) que puede ayudar a Engineer, maintenance (IT) de glbulos rojos. Recibir sangre de un donante a travs de una va intravenosa (transfusin de [place]). Esta ser necesaria si pierde [employer]. Realizar cambios en la dieta. Someterse a [place] para Nurse, mental health. Siga estas indicaciones en su casa: Use los medicamentos de venta libre y los recetados solamente como se lo haya indicado el mdico. Tome los suplementos solamente como se lo haya indicado el mdico. Siga las instrucciones del mdico en lo que respecta a la dieta. Concurra a [place]. El mdico posiblemente le pida que repita los anlisis de [place]. Comunquese con un mdico si: Tiene nuevos sangrados en cualquier parte del cuerpo. Se siente muy dbil. Solicite ayuda de inmediato si: Le falta el aire. Siente dolor en la espalda, el abdomen o el pecho. Se siente mareado o sufre un desmayo. Tiene dificultad para concentrarse. Sus heces tienen [place], son Laurence Aly o son alquitranadas. Vomita repetidamente  o vomita sangre. Estos sntomas pueden  Customer service manager. Solicite ayuda de inmediato. Llame al 911. No espere a ver si los sntomas desaparecen. No conduzca por sus propios medios [employer] hospital. Resumen La anemia es una afeccin en la que no hay suficientes glbulos rojos o la cantidad suficiente de la sustancia de los glbulos rojos que transporta el oxgeno. Los sntomas pueden ocurrir repentinamente o Audiological scientist. Si la anemia es leve, es posible que no tenga sntomas. Esta afeccin se diagnostica mediante anlisis de sangre, los antecedentes mdicos y un examen fsico. Pueden ser necesarios otros estudios. El tratamiento de esta afeccin depende de la causa de la anemia. Esta informacin no tiene Theme park manager el consejo del mdico. Asegrese de hacerle al mdico cualquier pregunta que tenga. Document Revised: 12/30/2021 Document Reviewed: 12/30/2021 Elsevier Patient Education  2024 [name].

## 2024-10-05 ENCOUNTER — Other Ambulatory Visit: Payer: Self-pay

## 2024-10-05 ENCOUNTER — Telehealth: Payer: Self-pay | Admitting: *Deleted

## 2024-10-05 ENCOUNTER — Ambulatory Visit: Payer: Self-pay | Admitting: Nurse Practitioner

## 2024-10-05 DIAGNOSIS — D649 Anemia, unspecified: Secondary | ICD-10-CM

## 2024-10-05 LAB — ANEMIA PROFILE B
Basos: 0 %
EOS (ABSOLUTE): 0 x10E3/uL (ref 0.0–0.2)
Eos: 1 %
Ferritin: 27 ng/mL (ref 15–150)
Folate: 18.4 ng/mL
Hematocrit: 31.7 % — AB (ref 34.0–46.6)
Hemoglobin: 8.3 g/dL — AB (ref 11.1–15.9)
Immature Granulocytes: 0 %
Immature Granulocytes: 0 x10E3/uL (ref 0.0–0.1)
Iron Saturation: 6 % — CL (ref 15–55)
Iron: 27 ug/dL (ref 27–159)
Lymphs: 19 %
MCH: 16.9 pg — AB (ref 26.6–33.0)
MCHC: 26.2 g/dL — AB (ref 31.5–35.7)
MCV: 65 fL — AB (ref 79–97)
Monocytes Absolute: 0.1 x10E3/uL (ref 0.0–0.4)
Monocytes Absolute: 0.5 x10E3/uL (ref 0.1–0.9)
Monocytes: 7 %
Neutrophils Absolute: 1.6 x10E3/uL (ref 0.7–3.1)
Neutrophils Absolute: 5.9 x10E3/uL (ref 1.4–7.0)
Neutrophils: 73 %
Platelets: 398 x10E3/uL (ref 150–450)
RBC: 4.91 x10E6/uL (ref 3.77–5.28)
RDW: 27.3 % — AB (ref 11.7–15.4)
Retic Ct Pct: 4.3 % — AB (ref 0.6–2.6)
Total Iron Binding Capacity: 450 ug/dL (ref 250–450)
UIBC: 423 ug/dL (ref 131–425)
Vitamin B-12: 954 pg/mL (ref 232–1245)
WBC: 8.1 x10E3/uL (ref 3.4–10.8)

## 2024-10-05 NOTE — Telephone Encounter (Signed)
 Toni Le, [facility] reporting critical lab:  Hemoglobin - 8.3

## 2024-10-05 NOTE — Telephone Encounter (Signed)
 Provider who saw her for this has result note in EPIC

## 2024-10-06 LAB — FECAL OCCULT BLOOD, IMMUNOCHEMICAL: Fecal Occult Bld: NEGATIVE

## 2024-10-08 ENCOUNTER — Ambulatory Visit: Payer: Self-pay | Admitting: Family

## 2024-10-10 ENCOUNTER — Telehealth: Payer: Self-pay

## 2024-10-10 NOTE — Telephone Encounter (Signed)
 Copied from CRM #[id number]. Topic: Clinical - Lab/Test Results >> Oct 10, 2024  1:02 PM Selinda RAMAN wrote: Reason for CRM: The patient called in along with the assistance from an interpreter to get her latest lab results. She states she would like to speak with a nurse as she has questions concerning anemia and her blood work results.

## 2024-10-30 ENCOUNTER — Ambulatory Visit: Admitting: Nurse Practitioner
